# Patient Record
Sex: Male | Born: 1955 | Hispanic: No | Marital: Married | Smoking: Former smoker
Health system: Southern US, Community
[De-identification: ages and names within clinical notes are randomized; demographics above are authoritative.]

## PROBLEM LIST (undated history)

## (undated) DIAGNOSIS — M1990 Unspecified osteoarthritis, unspecified site: Secondary | ICD-10-CM

## (undated) DIAGNOSIS — K219 Gastro-esophageal reflux disease without esophagitis: Secondary | ICD-10-CM

## (undated) DIAGNOSIS — F329 Major depressive disorder, single episode, unspecified: Secondary | ICD-10-CM

## (undated) DIAGNOSIS — C4490 Unspecified malignant neoplasm of skin, unspecified: Secondary | ICD-10-CM

## (undated) DIAGNOSIS — Z8042 Family history of malignant neoplasm of prostate: Secondary | ICD-10-CM

## (undated) DIAGNOSIS — M810 Age-related osteoporosis without current pathological fracture: Secondary | ICD-10-CM

## (undated) DIAGNOSIS — Z803 Family history of malignant neoplasm of breast: Secondary | ICD-10-CM

## (undated) DIAGNOSIS — C61 Malignant neoplasm of prostate: Secondary | ICD-10-CM

## (undated) DIAGNOSIS — F419 Anxiety disorder, unspecified: Secondary | ICD-10-CM

## (undated) DIAGNOSIS — F32A Depression, unspecified: Secondary | ICD-10-CM

## (undated) HISTORY — DX: Family history of malignant neoplasm of breast: Z80.3

## (undated) HISTORY — DX: Age-related osteoporosis without current pathological fracture: M81.0

## (undated) HISTORY — DX: Family history of malignant neoplasm of prostate: Z80.42

---
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
## 2003-03-15 HISTORY — PX: EYE SURGERY: SHX253

## 2004-05-21 DIAGNOSIS — E7800 Pure hypercholesterolemia, unspecified: Secondary | ICD-10-CM | POA: Insufficient documentation

## 2006-03-14 HISTORY — PX: MOLE REMOVAL: SHX2046

## 2006-06-15 DIAGNOSIS — R519 Headache, unspecified: Secondary | ICD-10-CM | POA: Insufficient documentation

## 2008-03-14 HISTORY — PX: NASAL SINUS SURGERY: SHX719

## 2015-01-30 DIAGNOSIS — N529 Male erectile dysfunction, unspecified: Secondary | ICD-10-CM | POA: Insufficient documentation

## 2015-01-30 DIAGNOSIS — F5101 Primary insomnia: Secondary | ICD-10-CM | POA: Insufficient documentation

## 2015-07-17 DIAGNOSIS — D126 Benign neoplasm of colon, unspecified: Secondary | ICD-10-CM | POA: Insufficient documentation

## 2015-09-04 DIAGNOSIS — N1831 Chronic kidney disease, stage 3a: Secondary | ICD-10-CM | POA: Insufficient documentation

## 2016-02-10 DIAGNOSIS — R7303 Prediabetes: Secondary | ICD-10-CM | POA: Insufficient documentation

## 2016-07-12 HISTORY — PX: BICEPS TENDON REPAIR: SHX566

## 2016-11-12 HISTORY — PX: SHOULDER ARTHROSCOPY WITH ROTATOR CUFF REPAIR: SHX5685

## 2018-02-15 DIAGNOSIS — K644 Residual hemorrhoidal skin tags: Secondary | ICD-10-CM | POA: Insufficient documentation

## 2018-08-03 DIAGNOSIS — K219 Gastro-esophageal reflux disease without esophagitis: Secondary | ICD-10-CM | POA: Insufficient documentation

## 2018-08-03 DIAGNOSIS — B351 Tinea unguium: Secondary | ICD-10-CM | POA: Insufficient documentation

## 2018-08-07 DIAGNOSIS — C775 Secondary and unspecified malignant neoplasm of intrapelvic lymph nodes: Secondary | ICD-10-CM | POA: Insufficient documentation

## 2018-08-07 DIAGNOSIS — C61 Malignant neoplasm of prostate: Secondary | ICD-10-CM | POA: Insufficient documentation

## 2018-09-11 DIAGNOSIS — K635 Polyp of colon: Secondary | ICD-10-CM | POA: Insufficient documentation

## 2019-05-22 ENCOUNTER — Other Ambulatory Visit (HOSPITAL_COMMUNITY): Payer: Self-pay | Admitting: Urology

## 2019-05-22 DIAGNOSIS — C61 Malignant neoplasm of prostate: Secondary | ICD-10-CM

## 2019-05-30 ENCOUNTER — Other Ambulatory Visit: Payer: Self-pay | Admitting: Urology

## 2019-06-03 ENCOUNTER — Encounter (HOSPITAL_COMMUNITY)
Admission: RE | Admit: 2019-06-03 | Discharge: 2019-06-03 | Disposition: A | Discharge status: Home / Self Care | Payer: Medicare Other | Source: Ambulatory Visit | Attending: Urology | Admitting: Urology

## 2019-06-03 ENCOUNTER — Other Ambulatory Visit: Payer: Self-pay

## 2019-06-03 DIAGNOSIS — C61 Malignant neoplasm of prostate: Secondary | ICD-10-CM | POA: Diagnosis not present

## 2019-06-03 MED ORDER — TECHNETIUM TC 99M MEDRONATE IV KIT
21.9000 | PACK | Freq: Once | INTRAVENOUS | Status: AC
  Administered 2019-06-03: 21.9 via INTRAVENOUS

## 2019-06-18 NOTE — Patient Instructions (Signed)
DUE TO COVID-19 ONLY ONE VISITOR IS ALLOWED TO COME WITH YOU AND STAY IN THE WAITING ROOM ONLY DURING PRE OP AND PROCEDURE DAY OF SURGERY. THE 1 VISITOR MAY VISIT WITH YOU AFTER SURGERY IN YOUR PRIVATE ROOM DURING VISITING HOURS ONLY!  YOU NEED TO HAVE A COVID 19 TEST ON__4/12_____ @_2 :00PM______, THIS TEST MUST BE DONE BEFORE SURGERY, COME  [name] , [id number].  ([place]) ONCE YOUR COVID TEST IS COMPLETED, PLEASE BEGIN THE QUARANTINE INSTRUCTIONS AS OUTLINED IN YOUR HANDOUT.                Brendan Anderson    Your procedure is scheduled on: 06/27/19   Report to [facility] Main  Entrance   Report to admitting at  9:15 AM     Call this number if you have problems the morning of surgery [phone]    Remember: Do not eat food or drink liquids :After Midnight.    BRUSH YOUR TEETH MORNING OF SURGERY AND RINSE YOUR MOUTH OUT, NO CHEWING GUM CANDY OR MINTS.     Take these medicines the morning of surgery with A SIP OF WATER: Trazodone, Zyrtec, Nexium                                 You may not have any metal on your body including              piercings  Do not wear jewelry,  lotions, powders or  deodorant                       Men may shave face and neck.   Do not bring valuables to the hospital. [place].  Contacts, dentures or bridgework may not be worn into surgery.        Special Instructions: N/A              Please read over the following fact sheets you were given: _____________________________________________________________________             [facility] - Preparing for Surgery Before surgery, you can play an important role.   Because skin is not sterile, your skin needs to be as free of germs as possible.   You can reduce the number of germs on your skin by washing with CHG (chlorahexidine gluconate) soap before surgery .  CHG is an antiseptic cleaner which kills  germs and bonds with the skin to continue killing germs even after washing. Please DO NOT use if you have an allergy to CHG or antibacterial soaps.   If your skin becomes reddened/irritated stop using the CHG and inform your nurse when you arrive at Short Stay. You may shave your face/neck.  Please follow these instructions carefully:  1.  Shower with CHG Soap the night before surgery and the  morning of Surgery.  2.  If you choose to wash your hair, wash your hair first as usual with your  normal  shampoo.  3.  After you shampoo, rinse your hair and body thoroughly to remove the  shampoo.  4.  Use CHG as you would any other liquid soap.  You can apply chg directly  to the skin and wash                       Gently with a scrungie or clean washcloth.  5.  Apply the CHG Soap to your body ONLY FROM THE NECK DOWN.   Do not use on face/ open                           Wound or open sores. Avoid contact with eyes, ears mouth and genitals (private parts).                       Wash face,  Genitals (private parts) with your normal soap.             6.  Wash thoroughly, paying special attention to the area where your surgery  will be performed.  7.  Thoroughly rinse your body with warm water from the neck down.  8.  DO NOT shower/wash with your normal soap after using and rinsing off  the CHG Soap.             9.  Pat yourself dry with a clean towel.            10.  Wear clean pajamas.            11.  Place clean sheets on your bed the night of your first shower and do not  sleep with pets. Day of Surgery : Do not apply any lotions/deodorants the morning of surgery.  Please wear clean clothes to the hospital/surgery center.  FAILURE TO FOLLOW THESE INSTRUCTIONS MAY RESULT IN THE CANCELLATION OF YOUR SURGERY PATIENT SIGNATURE_________________________________  NURSE  SIGNATURE__________________________________  ________________________________________________________________________   Brendan Anderson  An incentive spirometer is a tool that can help keep your lungs clear and active. This tool measures how well you are filling your lungs with each breath. Taking long deep breaths may help reverse or decrease the chance of developing breathing (pulmonary) problems (especially infection) following:  A long period of time when you are unable to move or be active. BEFORE THE PROCEDURE   If the spirometer includes an indicator to show your best effort, your nurse or respiratory therapist will set it to a desired goal.  If possible, sit up straight or lean slightly forward. Try not to slouch.  Hold the incentive spirometer in an upright position. INSTRUCTIONS FOR USE  1. Sit on the edge of your bed if possible, or sit up as far as you can in bed or on a chair. 2. Hold the incentive spirometer in an upright position. 3. Breathe out normally. 4. Place the mouthpiece in your mouth and seal your lips tightly around it. 5. Breathe in slowly and as deeply as possible, raising the piston or the ball toward the top of the column. 6. Hold your breath for 3-5 seconds or for as long as possible. Allow the piston or ball to fall to the bottom of the column. 7. Remove the mouthpiece from your mouth and breathe out normally. 8. Rest for a few seconds and repeat Steps 1 through 7 at least 10 times every 1-2 hours when you are awake. Take your time and take a few normal breaths between deep breaths. 9. The spirometer may include an indicator to show your best effort.  Use the indicator as a goal to work toward during each repetition. 10. After each set of 10 deep breaths, practice coughing to be sure your lungs are clear. If you have an incision (the cut made at the time of surgery), support your incision when coughing by placing a pillow or rolled up towels firmly  against it. Once you are able to get out of bed, walk around indoors and cough well. You may stop using the incentive spirometer when instructed by your caregiver.  RISKS AND COMPLICATIONS  Take your time so you do not get dizzy or light-headed.  If you are in pain, you may need to take or ask for pain medication before doing incentive spirometry. It is harder to take a deep breath if you are having pain. AFTER USE  Rest and breathe slowly and easily.  It can be helpful to keep track of a log of your progress. Your caregiver can provide you with a simple table to help with this. If you are using the spirometer at home, follow these instructions: [place] IF:   You are having difficultly using the spirometer.  You have trouble using the spirometer as often as instructed.  Your pain medication is not giving enough relief while using the spirometer.  You develop fever of 100.5 F (38.1 C) or higher. SEEK IMMEDIATE MEDICAL CARE IF:   You cough up bloody sputum that had not been present before.  You develop fever of 102 F (38.9 C) or greater.  You develop worsening pain at or near the incision site. MAKE SURE YOU:   Understand these instructions.  Will watch your condition.  Will get help right away if you are not doing well or get worse. Document Released: 07/11/2006 Document Revised: 05/23/2011 Document Reviewed: 09/11/2006 [facility] Patient Information 2014 [place], [place].   ________________________________________________________________________

## 2019-06-19 ENCOUNTER — Other Ambulatory Visit: Payer: Self-pay

## 2019-06-19 ENCOUNTER — Encounter (HOSPITAL_COMMUNITY)
Admission: RE | Admit: 2019-06-19 | Discharge: 2019-06-19 | Disposition: A | Discharge status: Home / Self Care | Payer: Medicare Other | Source: Ambulatory Visit | Attending: Urology | Admitting: Urology

## 2019-06-19 ENCOUNTER — Encounter (HOSPITAL_COMMUNITY): Payer: Self-pay

## 2019-06-19 DIAGNOSIS — Z01812 Encounter for preprocedural laboratory examination: Secondary | ICD-10-CM | POA: Insufficient documentation

## 2019-06-19 HISTORY — DX: Depression, unspecified: F32.A

## 2019-06-19 HISTORY — DX: Unspecified osteoarthritis, unspecified site: M19.90

## 2019-06-19 HISTORY — DX: Gastro-esophageal reflux disease without esophagitis: K21.9

## 2019-06-19 HISTORY — DX: Anxiety disorder, unspecified: F41.9

## 2019-06-19 LAB — BASIC METABOLIC PANEL
Anion gap: 9 (ref 5–15)
BUN: 20 mg/dL (ref 8–23)
CO2: 26 mmol/L (ref 22–32)
Calcium: 9.1 mg/dL (ref 8.9–10.3)
Chloride: 106 mmol/L (ref 98–111)
Creatinine, Ser: 1.21 mg/dL (ref 0.61–1.24)
GFR calc Af Amer: >60 mL/min (ref >60)
GFR calc non Af Amer: >60 mL/min (ref >60)
Glucose, Bld: 116 mg/dL — ABNORMAL HIGH (ref 70–99)
Potassium: 4.1 mmol/L (ref 3.5–5.1)
Sodium: 141 mmol/L (ref 135–145)

## 2019-06-19 LAB — CBC
HCT: 45 % (ref 39.0–52.0)
Hemoglobin: 14.8 g/dL (ref 13.0–17.0)
MCH: 29.8 pg (ref 26.0–34.0)
MCHC: 32.9 g/dL (ref 30.0–36.0)
MCV: 90.5 fL (ref 80.0–100.0)
Platelets: 185 10*3/uL (ref 150–400)
RBC: 4.97 MIL/uL (ref 4.22–5.81)
RDW: 12.2 % (ref 11.5–15.5)
WBC: 5.3 10*3/uL (ref 4.0–10.5)
nRBC: 0 % (ref 0.0–0.2)

## 2019-06-19 LAB — ABO/RH: ABO/RH(D): A POS

## 2019-06-19 NOTE — Progress Notes (Signed)
PCP - Dr. Eber Hong Cardiologist - no  Chest x-ray - no EKG - no Stress Test - no ECHO - no Cardiac Cath - no  Sleep Study - yes. Negative findings CPAP - no  Fasting Blood Sugar - NA Checks Blood Sugar _____ times a day  Blood Thinner Instructions:NAAspirin Instructions: Last Dose:  Anesthesia review:   Patient denies shortness of breath, fever, cough and chest pain at PAT appointment yes  Patient verbalized understanding of instructions that were given to them at the PAT appointment. Patient was also instructed that they will need to review over the PAT instructions again at home before surgery. yes

## 2019-06-24 ENCOUNTER — Other Ambulatory Visit (HOSPITAL_COMMUNITY)
Admission: RE | Admit: 2019-06-24 | Discharge: 2019-06-24 | Disposition: A | Discharge status: Home / Self Care | Payer: Medicare Other | Source: Ambulatory Visit | Attending: Urology | Admitting: Urology

## 2019-06-24 DIAGNOSIS — Z20822 Contact with and (suspected) exposure to covid-19: Secondary | ICD-10-CM | POA: Diagnosis not present

## 2019-06-24 DIAGNOSIS — Z01812 Encounter for preprocedural laboratory examination: Secondary | ICD-10-CM | POA: Insufficient documentation

## 2019-06-24 LAB — SARS CORONAVIRUS 2 (TAT 6-24 HRS): SARS Coronavirus 2: NEGATIVE

## 2019-06-26 NOTE — H&P (Signed)
CC/HPI: CC: Prostate Cancer   Physician requesting consult: Dr. Roxanne Gates  PCP: Dr. Eber Hong   Brendan Anderson is a 64 year old gentleman who was found to have an elevated PSA of 6.3 prompting a TRUS biopsy of the prostate on 04/23/19. 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   Family history: None.   Imaging studies: PENDING   PMH: He has a history of laryngopharyngeal reflux, depression, and arthritis.  PSH: No abdominal surgery.   TNM stage: cT1c N? M?  PSA: 6.3  Gleason score: 4+5=9  Biopsy (04/23/19): 6/12 cores positive  Left: L lateral apex (8%, 3+3=6)  Right: R lateral apex (60%, 5%, 40%, 4+5=9), R lateral mid (30%, 75%, 4+5=9)  Prostate volume: 41 cc   Nomogram  OC disease: 17%  EPE: 79%  SVI: 31%  LNI: 25%  PFS (5 year, 10 year): 47%, 32%   Urinary function: IPSS is 3.  Erectile function: SHIM score is 13. He is able to have fairly reliable erections with sildenafil, however.     ALLERGIES: Codeine    MEDICATIONS: Allergy  Esomeprazole Magnesium  Gaviscon  Multiple Vitamin  Sildenafil Citrate 100 mg tablet  Trazodone Hcl     GU PSH: None   NON-GU PSH: Cataract surgery Elbow Surgery (Unspecified) Rotator cuff surgery Sinus Surgery Procedure     GU PMH: None   NON-GU PMH: Arthritis GERD    FAMILY HISTORY: Breast Cancer - Mother    Notes: 1 son, 1 daughter   SOCIAL HISTORY: Marital Status: Married Preferred Language: English; Ethnicity: Not Hispanic Or Latino; Race: White Current Smoking Status: Patient does not smoke anymore. Has not smoked since 05/12/2009. Smoked for 40 years. Smoked 1 pack per day.   Tobacco Use Assessment Completed: Used Tobacco in last 30 days? Does not drink anymore.  Does not drink caffeine.    REVIEW OF SYSTEMS:    GU Review Male:   Patient reports get up at night to urinate. Patient denies frequent urination, hard to postpone urination, burning/ pain  with urination, leakage of urine, stream starts and stops, trouble starting your streams, and have to strain to urinate .  Gastrointestinal (Lower):   Patient denies diarrhea and constipation.  Gastrointestinal (Upper):   Patient denies nausea and vomiting.  Constitutional:   Patient reports fatigue. Patient denies fever, night sweats, and weight loss.  Skin:   Patient denies skin rash/ lesion and itching.  Eyes:   Patient denies blurred vision and double vision.  Ears/ Nose/ Throat:   Patient reports sinus problems. Patient denies sore throat.  Hematologic/Lymphatic:   Patient denies swollen glands and easy bruising.  Cardiovascular:   Patient denies leg swelling and chest pains.  Respiratory:   Patient denies cough and shortness of breath.  Endocrine:   Patient denies excessive thirst.  Musculoskeletal:   Patient reports back pain and joint pain.   Neurological:   Patient denies headaches and dizziness.  Psychologic:   Patient denies depression and anxiety.   VITAL SIGNS:      05/21/2019 11:04 AM  Weight 170 lb / 77.11 kg  Height 69 in / 175.26 cm BMI 25.1 kg/m     MULTI-SYSTEM PHYSICAL EXAMINATION:    Constitutional: Well-nourished. No physical deformities. Normally developed. Good grooming.  Neck: Neck symmetrical, not swollen. Normal tracheal position.  Respiratory: No labored breathing, no use of accessory muscles. Clear bilaterally  Cardiovascular: Normal temperature, normal extremity pulses, no  swelling, no varicosities. Regular rate and rhythm.  Lymphatic: No enlargement of neck, axillae, groin.  Skin: No paleness, no jaundice, no cyanosis. No lesion, no ulcer, no rash.  Neurologic / Psychiatric: Oriented to time, oriented to place, oriented to person. No depression, no anxiety, no agitation.  Gastrointestinal: No mass, no tenderness, no rigidity, non obese abdomen.  Eyes: Normal conjunctivae. Normal eyelids.  Ears, Nose, Mouth, and Throat: Left ear no scars, no lesions, no  masses. Right ear no scars, no lesions, no masses. Nose no scars, no lesions, no masses. Normal hearing. Normal lips.  Musculoskeletal: Normal gait and station of head and neck.      ASSESSMENT:      ICD-10 Details  1 GU:   Prostate Cancer - C61    PLAN:          1. High-risk prostate cancer: I had a detailed discussion with Brendan Anderson today regarding his prostate cancer diagnosis. We discussed the high grade nature his prostate cancer. I did recommend that he proceed with additional staging studies. He prefers to have these done in [place] and will be scheduled for a CT scan of the abdomen and pelvis as well as a bone scan. Assuming that he does not have evidence of obvious metastatic disease, we still discuss the potential risk of micro metastatic disease and the possible need for multi modality therapy to offer him the best chance for long-term disease-free survival.   The patient was counseled about the natural history of prostate cancer and the standard treatment options that are available for prostate cancer. It was explained to him how his age and life expectancy, clinical stage, Gleason score, and PSA affect his prognosis, the decision to proceed with additional staging studies, as well as how that information influences recommended treatment strategies. We discussed the roles for active surveillance, radiation therapy, surgical therapy, androgen deprivation, as well as ablative therapy options for the treatment of prostate cancer as appropriate to his individual cancer situation. We discussed the risks and benefits of these options with regard to their impact on cancer control and also in terms of potential adverse events, complications, and impact on quality of life particularly related to urinary and sexual function. The patient was encouraged to ask questions throughout the discussion today and all questions were answered to his stated satisfaction. In addition, the patient was  provided with and/or directed to appropriate resources and literature for further education about prostate cancer and treatment options. We discussed surgical therapy for prostate cancer including the different available surgical approaches. We discussed, in detail, the risks and expectations of surgery with regard to cancer control, urinary control, and erectile function as well as the expected postoperative recovery process. Additional risks of surgery including but not limited to bleeding, infection, hernia formation, nerve damage, lymphocele formation, bowel/rectal injury potentially necessitating colostomy, damage to the urinary tract resulting in urine leakage, urethral stricture, and the cardiopulmonary risks such as myocardial infarction, stroke, death, venothromboembolism, etc. were explained. The risk of open surgical conversion for robotic/laparoscopic prostatectomy was also discussed.   After our discussion, he does adamantly wished to proceed with primary surgical therapy if his staging studies do not indicate metastatic disease. We have agreed to proceed with scheduling pending these results. He will be scheduled for a unilateral left nerve-sparing robot assisted laparoscopic radical prostatectomy and bilateral pelvic lymphadenectomy. He has appropriate expectations with regard to erectile function considering his preoperative dysfunction and need to undergo unilateral nerve-sparing considering his higher volume and  high-grade disease on the right side of the prostate.

## 2019-06-27 ENCOUNTER — Encounter (HOSPITAL_COMMUNITY): Payer: Self-pay | Admitting: Urology

## 2019-06-27 ENCOUNTER — Other Ambulatory Visit: Payer: Self-pay

## 2019-06-27 ENCOUNTER — Ambulatory Visit (HOSPITAL_COMMUNITY): Payer: Medicare Other | Admitting: Anesthesiology

## 2019-06-27 ENCOUNTER — Encounter (HOSPITAL_COMMUNITY)
Admission: RE | Disposition: A | Discharge status: Home / Self Care | Payer: Self-pay | Source: Ambulatory Visit | Attending: Urology

## 2019-06-27 ENCOUNTER — Observation Stay (HOSPITAL_COMMUNITY)
Admission: RE | Admit: 2019-06-27 | Discharge: 2019-06-28 | Disposition: A | Discharge status: Home / Self Care | Payer: Medicare Other | Source: Ambulatory Visit | Attending: Urology | Admitting: Urology

## 2019-06-27 DIAGNOSIS — C61 Malignant neoplasm of prostate: Principal | ICD-10-CM | POA: Insufficient documentation

## 2019-06-27 DIAGNOSIS — Z885 Allergy status to narcotic agent status: Secondary | ICD-10-CM | POA: Diagnosis not present

## 2019-06-27 DIAGNOSIS — M1990 Unspecified osteoarthritis, unspecified site: Secondary | ICD-10-CM | POA: Insufficient documentation

## 2019-06-27 DIAGNOSIS — K219 Gastro-esophageal reflux disease without esophagitis: Secondary | ICD-10-CM | POA: Insufficient documentation

## 2019-06-27 DIAGNOSIS — Z87891 Personal history of nicotine dependence: Secondary | ICD-10-CM | POA: Insufficient documentation

## 2019-06-27 HISTORY — PX: LYMPHADENECTOMY: SHX5960

## 2019-06-27 HISTORY — PX: ROBOT ASSISTED LAPAROSCOPIC RADICAL PROSTATECTOMY: SHX5141

## 2019-06-27 LAB — TYPE AND SCREEN
ABO/RH(D): A POS
Antibody Screen: NEGATIVE

## 2019-06-27 LAB — HEMOGLOBIN AND HEMATOCRIT, BLOOD
HCT: 42.6 % (ref 39.0–52.0)
Hemoglobin: 13.9 g/dL (ref 13.0–17.0)

## 2019-06-27 SURGERY — XI ROBOTIC ASSISTED LAPAROSCOPIC RADICAL PROSTATECTOMY LEVEL 2
Anesthesia: General

## 2019-06-27 MED ORDER — BUPIVACAINE-EPINEPHRINE 0.25% -1:200000 IJ SOLN
INTRAMUSCULAR | Status: DC | PRN
  Administered 2019-06-27: 26 mL

## 2019-06-27 MED ORDER — STERILE WATER FOR IRRIGATION IR SOLN
Status: DC | PRN
  Administered 2019-06-27: 1000 mL

## 2019-06-27 MED ORDER — PROPOFOL 10 MG/ML IV BOLUS
INTRAVENOUS | Status: AC
  Filled 2019-06-27: qty 20

## 2019-06-27 MED ORDER — ONDANSETRON HCL 4 MG/2ML IJ SOLN
INTRAMUSCULAR | Status: AC
  Filled 2019-06-27: qty 2

## 2019-06-27 MED ORDER — DEXAMETHASONE SODIUM PHOSPHATE 10 MG/ML IJ SOLN
INTRAMUSCULAR | Status: AC
  Filled 2019-06-27: qty 1

## 2019-06-27 MED ORDER — CEFAZOLIN SODIUM-DEXTROSE 1-4 GM/50ML-% IV SOLN
1.0000 g | Freq: Three times a day (TID) | INTRAVENOUS | Status: AC
  Administered 2019-06-27 – 2019-06-28 (×2): 1 g via INTRAVENOUS
  Filled 2019-06-27 (×2): qty 50

## 2019-06-27 MED ORDER — LACTATED RINGERS IV SOLN: INTRAVENOUS | Status: DC

## 2019-06-27 MED ORDER — DOCUSATE SODIUM 100 MG PO CAPS
100.0000 mg | ORAL_CAPSULE | Freq: Two times a day (BID) | ORAL | Status: DC
  Administered 2019-06-27 – 2019-06-28 (×2): 100 mg via ORAL
  Filled 2019-06-27 (×2): qty 1

## 2019-06-27 MED ORDER — LIDOCAINE 2% (20 MG/ML) 5 ML SYRINGE
INTRAMUSCULAR | Status: AC
  Filled 2019-06-27: qty 5

## 2019-06-27 MED ORDER — SULFAMETHOXAZOLE-TRIMETHOPRIM 800-160 MG PO TABS: 1.0000 | ORAL_TABLET | Freq: Two times a day (BID) | ORAL | 0 refills | Status: DC

## 2019-06-27 MED ORDER — ACETAMINOPHEN 325 MG PO TABS
650.0000 mg | ORAL_TABLET | ORAL | Status: DC | PRN
  Administered 2019-06-28: 650 mg via ORAL
  Filled 2019-06-27: qty 2

## 2019-06-27 MED ORDER — BELLADONNA ALKALOIDS-OPIUM 16.2-60 MG RE SUPP
RECTAL | Status: AC
  Filled 2019-06-27: qty 1

## 2019-06-27 MED ORDER — ALBUMIN HUMAN 5 % IV SOLN: INTRAVENOUS | Status: DC | PRN

## 2019-06-27 MED ORDER — DIPHENHYDRAMINE HCL 12.5 MG/5ML PO ELIX: 12.5000 mg | ORAL_SOLUTION | Freq: Four times a day (QID) | ORAL | Status: DC | PRN

## 2019-06-27 MED ORDER — MIDAZOLAM HCL 2 MG/2ML IJ SOLN
INTRAMUSCULAR | Status: DC | PRN
  Administered 2019-06-27: 2 mg via INTRAVENOUS

## 2019-06-27 MED ORDER — SUFENTANIL CITRATE 50 MCG/ML IV SOLN
INTRAVENOUS | Status: AC
  Filled 2019-06-27: qty 1

## 2019-06-27 MED ORDER — HEPARIN SODIUM (PORCINE) 1000 UNIT/ML IJ SOLN
INTRAMUSCULAR | Status: AC
  Filled 2019-06-27: qty 1

## 2019-06-27 MED ORDER — MORPHINE SULFATE (PF) 2 MG/ML IV SOLN: 2.0000 mg | INTRAVENOUS | Status: DC | PRN

## 2019-06-27 MED ORDER — PANTOPRAZOLE SODIUM 40 MG PO TBEC
40.0000 mg | DELAYED_RELEASE_TABLET | Freq: Every day | ORAL | Status: DC
  Administered 2019-06-28: 40 mg via ORAL
  Filled 2019-06-27: qty 1

## 2019-06-27 MED ORDER — ONDANSETRON HCL 4 MG/2ML IJ SOLN: 4.0000 mg | INTRAMUSCULAR | Status: DC | PRN

## 2019-06-27 MED ORDER — KCL IN DEXTROSE-NACL 20-5-0.45 MEQ/L-%-% IV SOLN
INTRAVENOUS | Status: DC
  Filled 2019-06-27 (×2): qty 1000

## 2019-06-27 MED ORDER — HYDROMORPHONE HCL 1 MG/ML IJ SOLN
INTRAMUSCULAR | Status: DC | PRN
  Administered 2019-06-27 (×4): .5 mg via INTRAVENOUS

## 2019-06-27 MED ORDER — SCOPOLAMINE 1 MG/3DAYS TD PT72
1.0000 | MEDICATED_PATCH | TRANSDERMAL | Status: DC
  Administered 2019-06-27: 1.5 mg via TRANSDERMAL
  Filled 2019-06-27: qty 1

## 2019-06-27 MED ORDER — LIDOCAINE 2% (20 MG/ML) 5 ML SYRINGE
INTRAMUSCULAR | Status: DC | PRN
  Administered 2019-06-27: 60 mg via INTRAVENOUS

## 2019-06-27 MED ORDER — SUFENTANIL CITRATE 50 MCG/ML IV SOLN
INTRAVENOUS | Status: DC | PRN
  Administered 2019-06-27 (×2): 10 ug via INTRAVENOUS
  Administered 2019-06-27 (×2): 5 ug via INTRAVENOUS
  Administered 2019-06-27: 20 ug via INTRAVENOUS

## 2019-06-27 MED ORDER — PROPOFOL 10 MG/ML IV BOLUS
INTRAVENOUS | Status: DC | PRN
  Administered 2019-06-27: 120 mg via INTRAVENOUS
  Administered 2019-06-27: 30 mg via INTRAVENOUS

## 2019-06-27 MED ORDER — ONDANSETRON HCL 4 MG/2ML IJ SOLN
INTRAMUSCULAR | Status: DC | PRN
  Administered 2019-06-27: 4 mg via INTRAVENOUS

## 2019-06-27 MED ORDER — MIDAZOLAM HCL 2 MG/2ML IJ SOLN
INTRAMUSCULAR | Status: AC
  Filled 2019-06-27: qty 2

## 2019-06-27 MED ORDER — ZOLPIDEM TARTRATE 5 MG PO TABS: 5.0000 mg | ORAL_TABLET | Freq: Every evening | ORAL | Status: DC | PRN

## 2019-06-27 MED ORDER — PROMETHAZINE HCL 25 MG/ML IJ SOLN: 6.2500 mg | INTRAMUSCULAR | Status: DC | PRN

## 2019-06-27 MED ORDER — CEFAZOLIN SODIUM-DEXTROSE 2-4 GM/100ML-% IV SOLN
2.0000 g | Freq: Once | INTRAVENOUS | Status: AC
  Administered 2019-06-27: 2 g via INTRAVENOUS
  Filled 2019-06-27: qty 100

## 2019-06-27 MED ORDER — ROCURONIUM BROMIDE 10 MG/ML (PF) SYRINGE
PREFILLED_SYRINGE | INTRAVENOUS | Status: AC
  Filled 2019-06-27: qty 10

## 2019-06-27 MED ORDER — DIPHENHYDRAMINE HCL 50 MG/ML IJ SOLN: 12.5000 mg | Freq: Four times a day (QID) | INTRAMUSCULAR | Status: DC | PRN

## 2019-06-27 MED ORDER — FENTANYL CITRATE (PF) 100 MCG/2ML IJ SOLN: 25.0000 ug | INTRAMUSCULAR | Status: DC | PRN

## 2019-06-27 MED ORDER — BELLADONNA ALKALOIDS-OPIUM 16.2-60 MG RE SUPP
1.0000 | Freq: Four times a day (QID) | RECTAL | Status: DC | PRN
  Administered 2019-06-27: 1 via RECTAL

## 2019-06-27 MED ORDER — FLEET ENEMA 7-19 GM/118ML RE ENEM: 1.0000 | ENEMA | Freq: Once | RECTAL | Status: DC

## 2019-06-27 MED ORDER — KETOROLAC TROMETHAMINE 15 MG/ML IJ SOLN
15.0000 mg | Freq: Four times a day (QID) | INTRAMUSCULAR | Status: DC
  Administered 2019-06-27 – 2019-06-28 (×3): 15 mg via INTRAVENOUS
  Filled 2019-06-27 (×2): qty 1

## 2019-06-27 MED ORDER — SODIUM CHLORIDE (PF) 0.9 % IJ SOLN
INTRAMUSCULAR | Status: AC
  Filled 2019-06-27: qty 10

## 2019-06-27 MED ORDER — LORATADINE 10 MG PO TABS
10.0000 mg | ORAL_TABLET | Freq: Every day | ORAL | Status: DC
  Administered 2019-06-27 – 2019-06-28 (×2): 10 mg via ORAL
  Filled 2019-06-27 (×2): qty 1

## 2019-06-27 MED ORDER — SODIUM CHLORIDE 0.9 % IR SOLN
Status: DC | PRN
  Administered 2019-06-27: 1000 mL

## 2019-06-27 MED ORDER — ESMOLOL HCL 100 MG/10ML IV SOLN
INTRAVENOUS | Status: AC
  Filled 2019-06-27: qty 10

## 2019-06-27 MED ORDER — ROCURONIUM BROMIDE 10 MG/ML (PF) SYRINGE
PREFILLED_SYRINGE | INTRAVENOUS | Status: DC | PRN
  Administered 2019-06-27: 20 mg via INTRAVENOUS
  Administered 2019-06-27: 70 mg via INTRAVENOUS

## 2019-06-27 MED ORDER — DEXAMETHASONE SODIUM PHOSPHATE 10 MG/ML IJ SOLN
INTRAMUSCULAR | Status: DC | PRN
  Administered 2019-06-27: 10 mg via INTRAVENOUS

## 2019-06-27 MED ORDER — SUGAMMADEX SODIUM 200 MG/2ML IV SOLN
INTRAVENOUS | Status: DC | PRN
  Administered 2019-06-27: 200 mg via INTRAVENOUS

## 2019-06-27 MED ORDER — LACTATED RINGERS IV SOLN: INTRAVENOUS | Status: DC | PRN

## 2019-06-27 MED ORDER — ACETAMINOPHEN 500 MG PO TABS
1000.0000 mg | ORAL_TABLET | Freq: Once | ORAL | Status: AC
  Administered 2019-06-27: 1000 mg via ORAL
  Filled 2019-06-27: qty 2

## 2019-06-27 MED ORDER — MAGNESIUM CITRATE PO SOLN: 1.0000 | Freq: Once | ORAL | Status: DC

## 2019-06-27 MED ORDER — TRAMADOL HCL 50 MG PO TABS: 50.0000 mg | ORAL_TABLET | Freq: Four times a day (QID) | ORAL | 0 refills | Status: DC | PRN

## 2019-06-27 MED ORDER — ESMOLOL HCL 100 MG/10ML IV SOLN
INTRAVENOUS | Status: DC | PRN
  Administered 2019-06-27: 20 mg via INTRAVENOUS
  Administered 2019-06-27: 30 mg via INTRAVENOUS
  Administered 2019-06-27 (×2): 20 mg via INTRAVENOUS

## 2019-06-27 MED ORDER — BACITRACIN-NEOMYCIN-POLYMYXIN 400-5-5000 EX OINT: 1.0000 "application " | TOPICAL_OINTMENT | Freq: Three times a day (TID) | CUTANEOUS | Status: DC | PRN

## 2019-06-27 MED ORDER — BUPIVACAINE HCL 0.25 % IJ SOLN
INTRAMUSCULAR | Status: AC
  Filled 2019-06-27: qty 1

## 2019-06-27 MED ORDER — HYDROMORPHONE HCL 2 MG/ML IJ SOLN
INTRAMUSCULAR | Status: AC
  Filled 2019-06-27: qty 1

## 2019-06-27 MED ORDER — CELECOXIB 200 MG PO CAPS
200.0000 mg | ORAL_CAPSULE | Freq: Once | ORAL | Status: AC
  Administered 2019-06-27: 200 mg via ORAL
  Filled 2019-06-27: qty 1

## 2019-06-27 MED ORDER — SODIUM CHLORIDE 0.9 % IV BOLUS
1000.0000 mL | Freq: Once | INTRAVENOUS | Status: AC
  Administered 2019-06-27: 1000 mL via INTRAVENOUS

## 2019-06-27 MED ORDER — TRAZODONE HCL 100 MG PO TABS
100.0000 mg | ORAL_TABLET | Freq: Every day | ORAL | Status: DC
  Administered 2019-06-27: 100 mg via ORAL
  Filled 2019-06-27: qty 1

## 2019-06-27 SURGICAL SUPPLY — 62 items
APPLICATOR COTTON TIP 6 STRL (MISCELLANEOUS) ×2 IMPLANT
APPLICATOR COTTON TIP 6IN STRL (MISCELLANEOUS) ×4
CATH FOLEY 2WAY SLVR 18FR 30CC (CATHETERS) ×4 IMPLANT
CATH ROBINSON RED A/P 16FR (CATHETERS) ×4 IMPLANT
CATH ROBINSON RED A/P 8FR (CATHETERS) ×4 IMPLANT
CATH TIEMANN FOLEY 18FR 5CC (CATHETERS) ×4 IMPLANT
CHLORAPREP W/TINT 26 (MISCELLANEOUS) ×4 IMPLANT
CLIP VESOLOCK LG 6/CT PURPLE (CLIP) ×8 IMPLANT
COVER SURGICAL LIGHT HANDLE (MISCELLANEOUS) ×4 IMPLANT
COVER TIP SHEARS 8 DVNC (MISCELLANEOUS) ×2 IMPLANT
COVER TIP SHEARS 8MM DA VINCI (MISCELLANEOUS) ×2
COVER WAND RF STERILE (DRAPES) IMPLANT
CUTTER ECHEON FLEX ENDO 45 340 (ENDOMECHANICALS) ×4 IMPLANT
DECANTER SPIKE VIAL GLASS SM (MISCELLANEOUS) ×4 IMPLANT
DERMABOND ADVANCED (GAUZE/BANDAGES/DRESSINGS) ×2
DERMABOND ADVANCED .7 DNX12 (GAUZE/BANDAGES/DRESSINGS) ×2 IMPLANT
DRAIN CHANNEL RND F F (WOUND CARE) IMPLANT
DRAPE ARM DVNC X/XI (DISPOSABLE) ×8 IMPLANT
DRAPE COLUMN DVNC XI (DISPOSABLE) ×2 IMPLANT
DRAPE DA VINCI XI ARM (DISPOSABLE) ×8
DRAPE DA VINCI XI COLUMN (DISPOSABLE) ×2
DRAPE SURG IRRIG POUCH 19X23 (DRAPES) ×4 IMPLANT
DRSG TEGADERM 4X4.75 (GAUZE/BANDAGES/DRESSINGS) ×4 IMPLANT
ELECT PENCIL ROCKER SW 15FT (MISCELLANEOUS) ×2 IMPLANT
ELECT REM PT RETURN 15FT ADLT (MISCELLANEOUS) ×4 IMPLANT
GLOVE BIO SURGEON STRL SZ 6.5 (GLOVE) ×3 IMPLANT
GLOVE BIO SURGEONS STRL SZ 6.5 (GLOVE) ×1
GLOVE BIOGEL M STRL SZ7.5 (GLOVE) ×8 IMPLANT
GOWN STRL REUS W/TWL LRG LVL3 (GOWN DISPOSABLE) ×14 IMPLANT
HOLDER FOLEY CATH W/STRAP (MISCELLANEOUS) ×4 IMPLANT
IRRIG SUCT STRYKERFLOW 2 WTIP (MISCELLANEOUS) ×4
IRRIGATION SUCT STRKRFLW 2 WTP (MISCELLANEOUS) ×2 IMPLANT
IV LACTATED RINGERS 1000ML (IV SOLUTION) ×4 IMPLANT
KIT TURNOVER KIT A (KITS) IMPLANT
NDL SAFETY ECLIPSE 18X1.5 (NEEDLE) ×2 IMPLANT
NEEDLE HYPO 18GX1.5 SHARP (NEEDLE) ×2
PACK ROBOT UROLOGY CUSTOM (CUSTOM PROCEDURE TRAY) ×4 IMPLANT
PENCIL SMOKE EVACUATOR (MISCELLANEOUS) IMPLANT
RELOAD STAPLE 45 4.1 GRN THCK (STAPLE) ×2 IMPLANT
SEAL CANN UNIV 5-8 DVNC XI (MISCELLANEOUS) ×8 IMPLANT
SEAL XI 5MM-8MM UNIVERSAL (MISCELLANEOUS) ×8
SET TUBE SMOKE EVAC HIGH FLOW (TUBING) ×4 IMPLANT
SOLUTION ELECTROLUBE (MISCELLANEOUS) ×4 IMPLANT
STAPLE RELOAD 45 GRN (STAPLE) ×2 IMPLANT
STAPLE RELOAD 45MM GREEN (STAPLE) ×2
SUT ETHILON 3 0 PS 1 (SUTURE) ×4 IMPLANT
SUT MNCRL 3 0 RB1 (SUTURE) ×2 IMPLANT
SUT MNCRL 3 0 VIOLET RB1 (SUTURE) ×2 IMPLANT
SUT MNCRL AB 4-0 PS2 18 (SUTURE) ×8 IMPLANT
SUT MONOCRYL 3 0 RB1 (SUTURE) ×4
SUT VIC AB 0 CT1 27 (SUTURE) ×2
SUT VIC AB 0 CT1 27XBRD ANTBC (SUTURE) ×2 IMPLANT
SUT VIC AB 0 UR5 27 (SUTURE) ×4 IMPLANT
SUT VIC AB 2-0 SH 27 (SUTURE) ×2
SUT VIC AB 2-0 SH 27X BRD (SUTURE) ×2 IMPLANT
SUT VIC AB 3-0 SH 27 (SUTURE) ×2
SUT VIC AB 3-0 SH 27XBRD (SUTURE) IMPLANT
SUT VICRYL 0 UR6 27IN ABS (SUTURE) ×8 IMPLANT
SYR 27GX1/2 1ML LL SAFETY (SYRINGE) ×4 IMPLANT
TOWEL OR NON WOVEN STRL DISP B (DISPOSABLE) ×4 IMPLANT
TROCAR XCEL NON-BLD 5MMX100MML (ENDOMECHANICALS) IMPLANT
WATER STERILE IRR 1000ML POUR (IV SOLUTION) ×4 IMPLANT

## 2019-06-27 NOTE — Progress Notes (Signed)
Patient ID: Kate Stoneburner, male   DOB: 05-Feb-1956, 64 y.o.   MRN: [id number]  Post-op note  Subjective: The patient is doing well.  No complaints.  Objective: Vital signs in last 24 hours: Temp:  [97.8 F (36.6 C)-98.7 F (37.1 C)] 97.8 F (36.6 C) (04/15 1342) Pulse Rate:  [74-84] 84 (04/15 1345) Resp:  [15-18] 15 (04/15 1345) BP: (144-160)/(75-89) 160/88 (04/15 1345) SpO2:  [100 %] 100 % (04/15 1345) Weight:  [78.6 kg] 78.6 kg (04/15 0948)  Intake/Output from previous day: No intake/output data recorded. Intake/Output this shift: Total I/O In: 2750 [I.V.:2400; IV Piggyback:350] Out: 250 [Blood:250]  Physical Exam:  General: Alert and oriented. Abdomen: Soft, Nondistended. Incisions: Clean and dry. Urine: Light pink.  Lab Results: Recent Labs    06/27/19 1400  HGB 13.9  HCT 42.6    Assessment/Plan: POD#0   1) Continue to monitor, ambulate, IS   Pryor Curia. MD   LOS: 0 days   Dutch Gray 06/27/2019, 3:29 PM

## 2019-06-27 NOTE — Anesthesia Procedure Notes (Signed)
Procedure Name: Intubation Date/Time: 06/27/2019 10:57 AM Performed by: Sharlette Dense, CRNA Patient Re-evaluated:Patient Re-evaluated prior to induction Oxygen Delivery Method: Circle system utilized Preoxygenation: Pre-oxygenation with 100% oxygen Induction Type: IV induction Ventilation: Mask ventilation without difficulty and Oral airway inserted - appropriate to patient size Laryngoscope Size: Miller and 3 Grade View: Grade I Tube size: 8.0 mm Number of attempts: 1 Airway Equipment and Method: Stylet Placement Confirmation: ETT inserted through vocal cords under direct vision,  positive ETCO2 and breath sounds checked- equal and bilateral Secured at: 22 cm Tube secured with: Tape Dental Injury: Teeth and Oropharynx as per pre-operative assessment

## 2019-06-27 NOTE — Anesthesia Preprocedure Evaluation (Addendum)
Anesthesia Evaluation  Patient identified by MRN, date of birth, ID band Patient awake    Reviewed: Allergy & Precautions, NPO status , Patient's Chart, lab work & pertinent test results  History of Anesthesia Complications Negative for: history of anesthetic complications  Airway Mallampati: II  TM Distance: >3 FB Neck ROM: Full    Dental no notable dental hx. (+) Dental Advisory Given   Pulmonary neg pulmonary ROS, former smoker,    Pulmonary exam normal        Cardiovascular negative cardio ROS Normal cardiovascular exam     Neuro/Psych PSYCHIATRIC DISORDERS Anxiety Depression negative neurological ROS     GI/Hepatic Neg liver ROS, GERD  Medicated,  Endo/Other  negative endocrine ROS  Renal/GU negative Renal ROS     Musculoskeletal negative musculoskeletal ROS (+)   Abdominal   Peds  Hematology negative hematology ROS (+)   Anesthesia Other Findings Day of surgery medications reviewed with the patient.  Reproductive/Obstetrics                            Anesthesia Physical Anesthesia Plan  ASA: II  Anesthesia Plan: General   Post-op Pain Management:    Induction: Intravenous  PONV Risk Score and Plan: Ondansetron, Dexamethasone, Midazolam and Scopolamine patch - Pre-op  Airway Management Planned: Oral ETT  Additional Equipment:   Intra-op Plan:   Post-operative Plan: Extubation in OR  Informed Consent: I have reviewed the patients History and Physical, chart, labs and discussed the procedure including the risks, benefits and alternatives for the proposed anesthesia with the patient or authorized representative who has indicated his/her understanding and acceptance.     Dental advisory given  Plan Discussed with: Anesthesiologist, CRNA and Surgeon  Anesthesia Plan Comments:        Anesthesia Quick Evaluation

## 2019-06-27 NOTE — Discharge Instructions (Signed)

## 2019-06-27 NOTE — Op Note (Signed)
Preoperative diagnosis: Clinically localized adenocarcinoma of the prostate (clinical stage T1c N0 M0)  Postoperative diagnosis: Clinically localized adenocarcinoma of the prostate (clinical stage T1c N0 M0)  Procedure:  1. Robotic assisted laparoscopic radical prostatectomy (left nerve sparing) 2. Bilateral robotic assisted laparoscopic pelvic lymphadenectomy  Surgeon: Pryor Curia. M.D.  Assistant(s): Debbrah Alar, PA-C  An assistant was required for this surgical procedure.  The duties of the assistant included but were not limited to suctioning, passing suture, camera manipulation, retraction. This procedure would not be able to be performed without an Environmental consultant.   Anesthesia: General  Complications: None  EBL: 150 mL  IVF:  2000 mL crystalloid  Specimens: 1. Prostate and seminal vesicles 2. Right pelvic lymph nodes 3. Left pelvic lymph nodes  Disposition of specimens: Pathology  Drains: 1. 20 Fr coude catheter 2. # 19 Blake pelvic drain  Indication: Brendan Anderson is a 64 y.o. patient with clinically localized prostate cancer.  After a thorough review of the management options for treatment of prostate cancer, he elected to proceed with surgical therapy and the above procedure(s).  We have discussed the potential benefits and risks of the procedure, side effects of the proposed treatment, the likelihood of the patient achieving the goals of the procedure, and any potential problems that might occur during the procedure or recuperation. Informed consent has been obtained.  Description of procedure:  The patient was taken to the operating room and a general anesthetic was administered. He was given preoperative antibiotics, placed in the dorsal lithotomy position, and prepped and draped in the usual sterile fashion. Next a preoperative timeout was performed. A urethral catheter was placed into the bladder and a site was selected near the umbilicus for placement of  the camera port. This was placed using a standard open Hassan technique which allowed entry into the peritoneal cavity under direct vision and without difficulty. An 8 mm port was placed and a pneumoperitoneum established. The camera was then used to inspect the abdomen and there was no evidence of any intra-abdominal injuries or other abnormalities. The remaining abdominal ports were then placed. 8 mm robotic ports were placed in the right lower quadrant, left lower quadrant, and far left lateral abdominal wall. A 5 mm port was placed in the right upper quadrant and a 12 mm port was placed in the right lateral abdominal wall for laparoscopic assistance. All ports were placed under direct vision without difficulty. The surgical cart was then docked.   Utilizing the cautery scissors, the bladder was reflected posteriorly allowing entry into the space of Retzius and identification of the endopelvic fascia and prostate. The periprostatic fat was then removed from the prostate allowing full exposure of the endopelvic fascia. The endopelvic fascia was then incised from the apex back to the base of the prostate bilaterally and the underlying levator muscle fibers were swept laterally off the prostate thereby isolating the dorsal venous complex. The dorsal vein was then stapled and divided with a 45 mm Flex Echelon stapler. Attention then turned to the bladder neck which was divided anteriorly thereby allowing entry into the bladder and exposure of the urethral catheter. The catheter balloon was deflated and the catheter was brought into the operative field and used to retract the prostate anteriorly. The posterior bladder neck was then examined and was divided allowing further dissection between the bladder and prostate posteriorly until the vasa deferentia and seminal vessels were identified. The vasa deferentia were isolated, divided, and lifted anteriorly. The seminal vesicles  were dissected down to their tips with  care to control the seminal vascular arterial blood supply. These structures were then lifted anteriorly and the space between Denonvillier's fascia and the anterior rectum was developed with a combination of sharp and blunt dissection. This isolated the vascular pedicles of the prostate.  The lateral prostatic fascia on the left side of the prostate was then sharply incised allowing release of the neurovascular bundle. The vascular pedicle of the prostate on the left side was then ligated with Weck clips between the prostate and neurovascular bundle and divided with sharp cold scissor dissection resulting in neurovascular bundle preservation. On the right side, a wide non nerve sparing dissection was performed with Weck clips used to ligate the vascular pedicle of the prostate. The neurovascular bundle on the left side was then separated off the apex of the prostate and urethra.  The urethra was then sharply transected allowing the prostate specimen to be disarticulated. The pelvis was copiously irrigated and hemostasis was ensured. There was no evidence for rectal injury.  Attention then turned to the right pelvic sidewall. The fibrofatty tissue between the external iliac vein, confluence of the iliac vessels, hypogastric artery, and Cooper's ligament was dissected free from the pelvic sidewall with care to preserve the obturator nerve. Weck clips were used for lymphostasis and hemostasis. An identical procedure was performed on the contralateral side and the lymphatic packets were removed for permanent pathologic analysis.  Attention then turned to the urethral anastomosis. A 2-0 Vicryl slip knot was placed between Denonvillier's fascia, the posterior bladder neck, and the posterior urethra to reapproximate these structures. A double-armed 3-0 Monocryl suture was then used to perform a 360 running tension-free anastomosis between the bladder neck and urethra. A new urethral catheter was then placed into  the bladder and irrigated. There were no blood clots within the bladder and the anastomosis appeared to be watertight. A #19 Blake drain was then brought through the left lateral 8 mm port site and positioned appropriately within the pelvis. It was secured to the skin with a nylon suture. The surgical cart was then undocked. The right lateral 12 mm port site was closed at the fascial level with a 0 Vicryl suture placed laparoscopically. All remaining ports were then removed under direct vision. The prostate specimen was removed intact within the Endopouch retrieval bag via the periumbilical camera port site. This fascial opening was closed with two running 0 Vicryl sutures. 0.25% Marcaine was then injected into all port sites and all incisions were reapproximated at the skin level with 4-0 Monocryl subcuticular sutures and Dermabond. The patient appeared to tolerate the procedure well and without complications. The patient was able to be extubated and transferred to the recovery unit in satisfactory condition.   Pryor Curia MD

## 2019-06-27 NOTE — Interval H&P Note (Signed)
History and Physical Interval Note:  06/27/2019 10:11 AM  Brendan Anderson  has presented today for surgery, with the diagnosis of PROSTATE CANCER.  The various methods of treatment have been discussed with the patient and family. After consideration of risks, benefits and other options for treatment, the patient has consented to  Procedure(s): XI ROBOTIC ASSISTED LAPAROSCOPIC RADICAL PROSTATECTOMY LEVEL 2 (N/A) LYMPHADENECTOMY, PELVIC (Bilateral) as a surgical intervention.  The patient's history has been reviewed, patient examined, no change in status, stable for surgery.  I have reviewed the patient's chart and labs.  Questions were answered to the patient's satisfaction.     [employer]

## 2019-06-27 NOTE — Transfer of Care (Signed)
Immediate Anesthesia Transfer of Care Note  Patient: Brendan Anderson  Procedure(s) Performed: XI ROBOTIC ASSISTED LAPAROSCOPIC RADICAL PROSTATECTOMY LEVEL 2 (N/A ) LYMPHADENECTOMY, PELVIC (Bilateral )  Patient Location: PACU  Anesthesia Type:General  Level of Consciousness: drowsy  Airway & Oxygen Therapy: Patient Spontanous Breathing and Patient connected to face mask oxygen  Post-op Assessment: Report given to RN and Post -op Vital signs reviewed and stable  Post vital signs: Reviewed and stable  Last Vitals:  Vitals Value Taken Time  BP 144/89 06/27/19 1342  Temp    Pulse 84 06/27/19 1345  Resp 15 06/27/19 1345  SpO2 100 % 06/27/19 1345  Vitals shown include unvalidated device data.  Last Pain:  Vitals:   06/27/19 0951  TempSrc:   PainSc: 0-No pain         Complications: No apparent anesthesia complications

## 2019-06-28 ENCOUNTER — Encounter: Payer: Self-pay | Admitting: *Deleted

## 2019-06-28 DIAGNOSIS — C61 Malignant neoplasm of prostate: Secondary | ICD-10-CM | POA: Diagnosis not present

## 2019-06-28 LAB — HEMOGLOBIN AND HEMATOCRIT, BLOOD
HCT: 34.1 % — ABNORMAL LOW (ref 39.0–52.0)
HCT: 33.8 % — ABNORMAL LOW (ref 39.0–52.0)
Hemoglobin: 11.3 g/dL — ABNORMAL LOW (ref 13.0–17.0)
Hemoglobin: 11.1 g/dL — ABNORMAL LOW (ref 13.0–17.0)

## 2019-06-28 MED ORDER — BISACODYL 10 MG RE SUPP
10.0000 mg | Freq: Once | RECTAL | Status: AC
  Administered 2019-06-28: 10 mg via RECTAL
  Filled 2019-06-28: qty 1

## 2019-06-28 MED ORDER — TRAMADOL HCL 50 MG PO TABS: 50.0000 mg | ORAL_TABLET | Freq: Four times a day (QID) | ORAL | Status: DC | PRN

## 2019-06-28 MED ORDER — CHLORHEXIDINE GLUCONATE CLOTH 2 % EX PADS
6.0000 | MEDICATED_PAD | Freq: Every day | CUTANEOUS | Status: DC
  Administered 2019-06-28: 6 via TOPICAL

## 2019-06-28 NOTE — Progress Notes (Signed)
Patient ID: Brendan Anderson, male   DOB: [birth year], 64 y.o.   MRN: [id number]  1 Day Post-Op Subjective: The patient is doing well.  No nausea or vomiting. Pain is adequately controlled.  Objective: Vital signs in last 24 hours: Temp:  [97.8 F (36.6 C)-98.8 F (37.1 C)] 98.4 F (36.9 C) (04/16 0617) Pulse Rate:  [69-94] 77 (04/16 0617) Resp:  [13-22] 18 (04/16 0553) BP: (103-162)/(61-90) 103/77 (04/16 0617) SpO2:  [96 %-100 %] 96 % (04/16 0617) Weight:  [78.6 kg] 78.6 kg (04/15 0948)  Intake/Output from previous day: 04/15 0701 - 04/16 0700 In: 2870 [P.O.:120; I.V.:2400; IV Piggyback:350] Out: 2185 [Urine:1800; Drains:135; Blood:250] Intake/Output this shift: No intake/output data recorded.  Physical Exam:  General: Alert and oriented. CV: RRR Lungs: Clear bilaterally. GI: Soft, Nondistended. Incisions: Clean, dry, and intact Urine: Clear Extremities: Nontender, no erythema, no edema.  Lab Results: Recent Labs    06/27/19 1400 06/28/19 0456  HGB 13.9 11.3*  HCT 42.6 34.1*      Assessment/Plan: POD# 1 s/p robotic prostatectomy.  1) SL IVF 2) Ambulate, Incentive spirometry 3) Transition to oral pain medication 4) Dulcolax suppository 5) D/C pelvic drain 6) Plan for likely discharge later today   Brendan Anderson. MD   LOS: 0 days   Dutch Gray 06/28/2019, 7:39 AM

## 2019-06-28 NOTE — Discharge Summary (Signed)
  Date of admission: 06/27/2019  Date of discharge: 06/28/2019  Admission diagnosis: Prostate Cancer  Discharge diagnosis: Prostate Cancer  History and Physical: For full details, please see admission history and physical. Briefly, Brendan Anderson is a 64 y.o. gentleman with localized prostate cancer.  After discussing management/treatment options, he elected to proceed with surgical treatment.  Hospital Course: Brendan Anderson was taken to the operating room on 06/27/2019 and underwent a robotic assisted laparoscopic radical prostatectomy. He tolerated this procedure well and without complications. Postoperatively, he was able to be transferred to a regular hospital room following recovery from anesthesia.  He was able to begin ambulating the night of surgery. He remained hemodynamically stable overnight.  He had excellent urine output with appropriately minimal output from his pelvic drain and his pelvic drain was removed on POD #1.  He was transitioned to oral pain medication, tolerated a clear liquid diet, and had met all discharge criteria and was able to be discharged home later on POD#1.  Laboratory values:  Recent Labs    06/27/19 1400 06/28/19 0456 06/28/19 1049  HGB 13.9 11.3* 11.1*  HCT 42.6 34.1* 33.8*    Disposition: Home  Discharge instruction: He was instructed to be ambulatory but to refrain from heavy lifting, strenuous activity, or driving. He was instructed on urethral catheter care.  Discharge medications:   Allergies as of 06/28/2019      Reactions   Codeine Other (See Comments)   "MAKES HIM HYPER"      Medication List    STOP taking these medications   multivitamin with minerals Tabs tablet     TAKE these medications   esomeprazole 40 MG capsule Commonly known as: NEXIUM Take 40 mg by mouth daily before breakfast.   GAVISCON-2 PO Take 2 tablets by mouth at bedtime.   sildenafil 100 MG tablet Commonly known as: VIAGRA Take 25 mg by mouth daily as  needed for erectile dysfunction.   sulfamethoxazole-trimethoprim 800-160 MG tablet Commonly known as: BACTRIM DS Take 1 tablet by mouth 2 (two) times daily. Start the day prior to foley removal appointment   traMADol 50 MG tablet Commonly known as: Ultram Take 1-2 tablets (50-100 mg total) by mouth every 6 (six) hours as needed for moderate pain or severe pain.   traZODone 100 MG tablet Commonly known as: DESYREL Take 100 mg by mouth at bedtime.   ZyrTEC Allergy 10 MG tablet Generic drug: cetirizine Take 10 mg by mouth daily.       Followup: He will followup in 1 week for catheter removal and to discuss his surgical pathology results.

## 2019-06-28 NOTE — Progress Notes (Signed)
Pt to be discharged to home this afternoon. Pt and Pt's Wife given discharge teaching and all Medications and schedules reviewed with Pt and Pt's Wife. Home teaching regarding foley catheter care and converting to leg bag demonstrated for both Pt and Pt's Wife. Understanding verbalized of al discharge teaching. Discharge packet with Pt at time of discharge

## 2019-06-29 NOTE — Anesthesia Postprocedure Evaluation (Signed)
Anesthesia Post Note  Patient: Brendan Anderson  Procedure(s) Performed: XI ROBOTIC ASSISTED LAPAROSCOPIC RADICAL PROSTATECTOMY LEVEL 2 (N/A ) LYMPHADENECTOMY, PELVIC (Bilateral )     Patient location during evaluation: PACU Anesthesia Type: General Level of consciousness: sedated Pain management: pain level controlled Vital Signs Assessment: post-procedure vital signs reviewed and stable Respiratory status: spontaneous breathing and respiratory function stable Cardiovascular status: stable Postop Assessment: no apparent nausea or vomiting Anesthetic complications: no                  Marializ Ferrebee DANIEL

## 2019-07-03 LAB — SURGICAL PATHOLOGY

## 2019-10-25 ENCOUNTER — Ambulatory Visit: Payer: Medicare Other | Admitting: Radiation Oncology

## 2019-10-25 DIAGNOSIS — I700 Atherosclerosis of aorta: Secondary | ICD-10-CM | POA: Insufficient documentation

## 2019-10-29 ENCOUNTER — Encounter: Payer: Self-pay | Admitting: Medical Oncology

## 2019-10-31 ENCOUNTER — Encounter: Payer: Self-pay | Admitting: Medical Oncology

## 2019-10-31 NOTE — Progress Notes (Signed)
Left a message requesting a return call to discuss referral to the [facility] 8/31. I mailed packet of information on the clinic and medical forms to patient.

## 2019-11-04 ENCOUNTER — Encounter: Payer: Self-pay | Admitting: Medical Oncology

## 2019-11-04 NOTE — Progress Notes (Signed)
Pt returned call regarding [facility] referral.   I introduced myself as the Prostate Nurse Navigator and the Coordinator of the [facility] [place].  1. I confirmed with the patient he is aware of his referral to the clinic 8/31, arriving at 12:30pm.   2. I discussed the format of the clinic and the physicians he will be seeing that day.  3. I discussed where the clinic is located and how to contact me. I reviewed [place] parking, registration and COVID protocol.   4. I confirmed he received packet of information and forms to be completed. I asked him to bring them with him the day of his appointment.   He voiced understanding of the above. I asked him to call me if he has any questions or concerns regarding his appointments or the forms he needs to complete.

## 2019-11-11 ENCOUNTER — Encounter: Payer: Self-pay | Admitting: Medical Oncology

## 2019-11-11 NOTE — Progress Notes (Signed)
Left message to confirm appointment for [facility] 8/31, arriving @ 12:30 pm. I reviewed location, valet parking, registration and COVID protocol. I reminded him to bring his completed medical forms and to have lunch prior to arrival.

## 2019-11-12 ENCOUNTER — Encounter: Payer: Self-pay | Admitting: General Practice

## 2019-11-12 ENCOUNTER — Encounter: Payer: Self-pay | Admitting: Medical Oncology

## 2019-11-12 ENCOUNTER — Ambulatory Visit
Admission: RE | Admit: 2019-11-12 | Discharge: 2019-11-12 | Disposition: A | Discharge status: Home / Self Care | Payer: Medicare Other | Source: Ambulatory Visit | Attending: Radiation Oncology | Admitting: Radiation Oncology

## 2019-11-12 ENCOUNTER — Other Ambulatory Visit: Payer: Self-pay

## 2019-11-12 ENCOUNTER — Encounter: Payer: Self-pay | Admitting: Radiation Oncology

## 2019-11-12 ENCOUNTER — Inpatient Hospital Stay: Payer: Medicare Other | Attending: Oncology | Admitting: Oncology

## 2019-11-12 VITALS — BP 138/86 | HR 77 | Temp 98.7°F | Resp 18 | Ht 69.0 in | Wt 177.2 lb

## 2019-11-12 DIAGNOSIS — J309 Allergic rhinitis, unspecified: Secondary | ICD-10-CM | POA: Insufficient documentation

## 2019-11-12 DIAGNOSIS — J329 Chronic sinusitis, unspecified: Secondary | ICD-10-CM | POA: Insufficient documentation

## 2019-11-12 DIAGNOSIS — Z87891 Personal history of nicotine dependence: Secondary | ICD-10-CM | POA: Diagnosis not present

## 2019-11-12 DIAGNOSIS — C775 Secondary and unspecified malignant neoplasm of intrapelvic lymph nodes: Secondary | ICD-10-CM

## 2019-11-12 DIAGNOSIS — C61 Malignant neoplasm of prostate: Secondary | ICD-10-CM | POA: Insufficient documentation

## 2019-11-12 HISTORY — DX: Malignant neoplasm of prostate: C61

## 2019-11-12 HISTORY — DX: Unspecified malignant neoplasm of skin, unspecified: C44.90

## 2019-11-12 NOTE — Progress Notes (Signed)
Radiation Oncology         [phone] ________________________________  Multidisciplinary Prostate Cancer Clinic  Initial Radiation Oncology Consultation  Name: Brendan Anderson MRN: [id number]  Date: 11/12/2019  DOB: May 15, 1955  OE:VOJJK, Brendan Dibbles, MD  Brendan Bring, MD   REFERRING PHYSICIAN: Raynelle Bring, MD  DIAGNOSIS: 64 y.o. gentleman with a rising, detectable postoperative PSA of 0.38 s/p RALP in 06/2019 for stage pT3b, pN1, Gleason 5+4 prostate cancer    ICD-10-CM   1. Prostate CA ([place])  C61   2. Prostate cancer metastatic to intrapelvic lymph node ([place])  C61    C77.5     HISTORY OF PRESENT ILLNESS::Brendan Anderson is a 64 y.o. gentleman.  He was initially diagnosed with Gleason 4+5 adenocarcinoma of the prostate on biopsy with Dr. Lerry Anderson, in [place], [place], with a PSA of 6.3.   Initial biopsy in [place]:   He was referred to Dr. Alinda Anderson at [facility] in consideration for robotic prostatectomy.  He underwent staging scans here in [place] on 06/03/2019 with CT A/P and bone scan both negative for evidence of visceral or osseous metastatic disease.  He elected to proceed with RALP with BPLND on 06/27/2019 under the care of Dr. Alinda Anderson.  Final surgical pathology revealed pT3bN1, Gleason 5+4,  prostatic adenocarcinoma with extraprostatic extension present at right posterior mid, bladder neck, and bilateral posterior base as well as bilateral seminal vesicle invasion and lymphovascular invasion present.  Surgical margins were negative but one out of nine (four right and five left) sampled pelvic lymph nodes were positive for metastatic adenocarcinoma, measuring 1.0 cm with extranodal extension (1/9).  From prostatectomy 06/27/19:   Unfortunately, his postoperative PSA on 09/19/2019 was detectable at 0.2 and his most recent PSA performed on 10/29/2019 was further elevated at 0.38.  The patient reviewed the surgical pathology and PSA results with his urologist and he  has kindly been referred today to the multidisciplinary prostate cancer clinic for presentation of pathology and radiology studies in our conference for discussion of potential radiation treatment options and clinical evaluation.  PREVIOUS RADIATION THERAPY: No  PAST MEDICAL HISTORY:  has a past medical history of Anxiety, Arthritis, Depression, GERD (gastroesophageal reflux disease), Prostate cancer ([place]), and Skin cancer.    PAST SURGICAL HISTORY: Past Surgical History:  Procedure Laterality Date  . BICEPS TENDON REPAIR Left 07/2016  . EYE SURGERY Bilateral 2005  . LYMPHADENECTOMY Bilateral 06/27/2019   Procedure: LYMPHADENECTOMY, PELVIC;  Surgeon: Brendan Bring, MD;  Location: [facility];  Service: Urology;  Laterality: Bilateral;  . MOLE REMOVAL  2008   pre CA from face  . NASAL SINUS SURGERY  2010  . ROBOT ASSISTED LAPAROSCOPIC RADICAL PROSTATECTOMY N/A 06/27/2019   Procedure: XI ROBOTIC ASSISTED LAPAROSCOPIC RADICAL PROSTATECTOMY LEVEL 2;  Surgeon: Brendan Bring, MD;  Location: [facility];  Service: Urology;  Laterality: N/A;  . SHOULDER ARTHROSCOPY WITH ROTATOR CUFF REPAIR Left 11/2016    FAMILY HISTORY: family history includes Brendan Anderson in his mother.  SOCIAL HISTORY:  reports that he quit smoking about 36 years ago. His smoking use included cigars. He has a 2.50 pack-year smoking history. He has never used smokeless tobacco. He reports current alcohol use. He reports that he does not use drugs.  ALLERGIES: Codeine  MEDICATIONS:  Current Outpatient Medications  Medication Sig Dispense Refill  . Al Hyd-Mg Tr-Alg Ac-Sod Bicarb (GAVISCON-2 PO) Take 2 tablets by mouth at bedtime.    . cetirizine (ZYRTEC ALLERGY) 10 MG tablet Take 10 mg by mouth daily.    Marland Kitchen  esomeprazole (NEXIUM) 40 MG capsule Take 1 capsule by mouth daily.    . Multiple Vitamin (MULTIVITAMIN) tablet Take 1 tablet by mouth daily.    . sildenafil (VIAGRA) 100 MG tablet Take 25 mg by mouth daily as needed for erectile  dysfunction.    . traZODone (DESYREL) 100 MG tablet Take 100 mg by mouth at bedtime.     No current facility-administered medications for this encounter.    REVIEW OF SYSTEMS:  On review of systems, the patient reports that he is doing well overall. He denies any chest pain, shortness of breath, cough, fevers, chills, night sweats, unintended weight changes. He denies any bowel disturbances, and denies abdominal pain, nausea or vomiting. He denies any new musculoskeletal or joint aches or pains. His IPSS was 2, indicating minimal urinary symptoms with nocturia x1 and urinary frequency.  He does continue with mild urinary incontinence postoperatively, wearing approximately 2 pads per day for protection.  His SHIM was 13, indicating he has mild to moderate erectile dysfunction. A complete review of systems is obtained and is otherwise negative.   PHYSICAL EXAM:  Wt Readings from Last 3 Encounters:  11/12/19 177 lb 3.2 oz (80.4 kg)  06/27/19 173 lb 4.5 oz (78.6 kg)  06/19/19 173 lb 4 oz (78.6 kg)   Temp Readings from Last 3 Encounters:  11/12/19 98.7 F (37.1 C)  06/28/19 98.4 F (36.9 C) (Oral)  06/19/19 98.7 F (37.1 C) (Oral)   BP Readings from Last 3 Encounters:  11/12/19 138/86  06/28/19 103/77  06/19/19 133/83   Pulse Readings from Last 3 Encounters:  11/12/19 77  06/28/19 77  06/19/19 74   Pain Assessment Pain Score: 0-No pain/10  In general this is a well appearing Caucasian male in no acute distress. He is alert and oriented x4 and appropriate throughout the examination. HEENT reveals that the patient is normocephalic, atraumatic. EOMs are intact. PERRLA. Skin is intact without any evidence of gross lesions. Cardiopulmonary assessment is negative for acute distress and he exhibits normal effort. The abdomen is soft, non tender, non distended. Lower extremities are negative for pretibial pitting edema, deep calf tenderness, cyanosis or clubbing.  KPS = 100  100 - Normal;  no complaints; no evidence of disease. 90   - Able to carry on normal activity; minor signs or symptoms of disease. 80   - Normal activity with effort; some signs or symptoms of disease. 36   - Cares for self; unable to carry on normal activity or to do active work. 60   - Requires occasional assistance, but is able to care for most of his personal needs. 50   - Requires considerable assistance and frequent medical care. 75   - Disabled; requires special care and assistance. 37   - Severely disabled; hospital admission is indicated although death not imminent. 59   - Very sick; hospital admission necessary; active supportive treatment necessary. 10   - Moribund; fatal processes progressing rapidly. 0     - Dead  Karnofsky DA, Abelmann [place], Craver LS and Burchenal [facility] [phone]) The use of the nitrogen mustards in the palliative treatment of carcinoma: with particular reference to bronchogenic carcinoma Cancer 1 634-56   LABORATORY DATA:  Lab Results  Component Value Date   WBC 5.3 06/19/2019   HGB 11.1 (L) 06/28/2019   HCT 33.8 (L) 06/28/2019   MCV 90.5 06/19/2019   PLT 185 06/19/2019   Lab Results  Component Value Date   NA 141 06/19/2019  K 4.1 06/19/2019   CL 106 06/19/2019   CO2 26 06/19/2019   No results found for: ALT, AST, GGT, ALKPHOS, BILITOT   RADIOGRAPHY: No results found.    IMPRESSION/PLAN: 64 y.o. gentleman with a rising, detectable postoperative PSA of 0.38 s/p RALP in 06/2019 for stage pT3b, pN1, Gleason 5+4 prostate cancer.  Today we reviewed the findings and workup thus far.  We discussed the natural history of prostate cancer.  We reviewed the the implications of positive margins, extracapsular extension, and seminal vesicle involvement on the risk of prostate cancer recurrence, particularly in light of detectable, rising postoperative PSA. We reviewed some of the evidence suggesting an advantage for patients who undergo salvage radiotherapy in this setting in terms  of disease control and overall survival. We discussed radiation treatment directed to the prostatic fossa and pelvic lymph nodes with regard to the logistics and delivery of a 7-1/2-week course of daily external beam radiation treatments. We also detailed the role of ADT in the setting of high risk prostate cancer and outlined the associated side effects that could be expected with this therapy.  We reviewed studies that show that extending radiation therapy to the pelvic lymph nodes combined with adding short-term hormone therapy to standard treatment of the prostate surgical bed, in the setting of high risk prostate cancer with adverse pathology findings, can extend the amount of time before disease progression as well as improved overall survival benefit.   He was encouraged to ask questions that were answered to his stated satisfaction.  He appears to have a good understanding of his disease and our treatment recommendations which are of curative intent.  At the end of the conversation, the patient is interested in moving forward with salvage radiotherapy to the prostate fossa, concurrent with ST-ADT.  He will also meet with Dr. Alinda Anderson and Dr. Alen Blew as part of today's cancer conference to ascertain their opinion and pending we are all in agreement, he will proceed with starting ADT in the near future, prior to CT simulation which is tentatively scheduled for 11/20/2019 at 10 AM.  He has freely signed written consent to proceed today in the office and a copy of this document will be placed in his medical record.  We enjoyed meeting him today and look forward to continuing to participate in his care.     Nicholos Johns, PA-C    Tyler Pita, MD  [facility] Direct Dial: [phone]  Fax: [phone] [email or web address]  Skype  [email or web address]   This document serves as a record of services personally performed by Tyler Pita, MD and Freeman Caldron, PA-C. It was created on their  behalf by Wilburn Mylar, a trained medical scribe. The creation of this record is based on the scribe's personal observations and the provider's statements to them. This document has been checked and approved by the attending provider.

## 2019-11-12 NOTE — Consult Note (Signed)
Multi-Disciplinary Clinic 11/12/2019    Finas Delone         MRN: [id number] PRIMARY CARE:  Eber Hong   REFERRING:  Aloha Gell. Hurt, MD   PROVIDER:  Mertha Finders, PT  DOB: [birth year], 64 year old Male TREATING:  Raynelle Bring, M.D.  SSN:  LOCATION:  [facility] [phone]    CC/HPI: CC: Prostate Cancer   PCP: Dr. Eber Hong  Location of consult: [facility] - Prostate Cancer Multidisciplinary Clinic   Mr. Lorentz is a 64 year old gentleman who was found to have an elevated PSA of 6.3 prompting a TRUS biopsy of the prostate on 04/23/19. 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 CT imaging of the abdomen and pelvis and whole body bone scan imaging was performed on 06/03/19 and did not indicate evidence of metastatic disease. He proceeded with surgical therapy and underwent a UNS RAL radical prostatectomy and BPLND on 06/27/19. Pathology indicated a pT3b N1 Mx, Gleason 5+4=9 adenocarcinoma with negative surgical margins. 1 out of 9 lymph nodes was positive for malignancy. His PSA was 0.20 on 09/19/19 and was further increased to 0.38 when checked prior to this visit on 10/29/19.   He has regained his continence recently according to physical therapy and he confirms this today.   Family history: None.   PMH: He has a history of laryngopharyngeal reflux, depression, and arthritis.  PSH: Robotic prostatectomy and BPLND.     ALLERGIES: Codeine    MEDICATIONS: Sildenafil Citrate 100 mg tablet 1 tablet PO as directed PRN  Allergy  Esomeprazole Magnesium  Gaviscon  Multiple Vitamin  Sildenafil Citrate 100 mg tablet  Trazodone Hcl     GU PSH: Laparoscopy; Lymphadenectomy - 06/27/2019 Locm 300-399Mg /Ml Iodine,1Ml - 06/03/2019 Robotic Radical Prostatectomy - 06/27/2019     NON-GU PSH: Cataract surgery Elbow Surgery (Unspecified) Rotator cuff surgery Sinus Surgery Procedure         GU PMH:  Stress Incontinence - 10/29/2019, - 09/19/2019, - 09/03/2019, - 08/09/2019, - 07/25/2019, - 07/02/2019 ED due to arterial insufficiency - 07/02/2019 Prostate Cancer - 05/21/2019      PMH Notes:   1) Prostate cancer: He is s/p a UNS RAL radical prostatectomy and BPLND on 06/27/19.   Diagnosis: pT3b N1 Mx, Gleason 5+4=9 adenocarcinoma with negative surgical margins (1/9 lymph nodes)  Pretreatment PSA: 6.3  Pretreatment SHIM score: 13   NON-GU PMH: Muscle weakness (generalized) - 10/29/2019, - 09/19/2019 Other muscle spasm - 10/29/2019, - 09/19/2019 Arthritis GERD    FAMILY HISTORY: Breast Cancer - Mother    Notes: 1 son, 1 daughter   SOCIAL HISTORY: Marital Status: Married Preferred Language: English; Ethnicity: Not Hispanic Or Latino; Race: White Current Smoking Status: Patient does not smoke anymore. Has not smoked since 05/12/2009. Smoked for 40 years. Smoked 1 pack per day.  <DIV'  Tobacco Use Assessment Completed:  Used Tobacco in last 30 days?   Does not drink anymore.  Does not drink caffeine.    REVIEW OF SYSTEMS:    GU Review Male:  Patient denies frequent urination, hard to postpone urination, burning/ pain with urination, get up at night to urinate, leakage of urine, stream starts and stops, trouble starting your streams, and have to strain to urinate .   Gastrointestinal (Lower):  Patient denies diarrhea and constipation.   Gastrointestinal (Upper):  Patient denies nausea and vomiting.   Constitutional:  Patient denies fever, night sweats, weight  loss, and fatigue.   Skin:  Patient denies skin rash/ lesion and itching.   Eyes:  Patient denies blurred vision and double vision.   Ears/ Nose/ Throat:  Patient denies sore throat and sinus problems.   Hematologic/Lymphatic:  Patient denies easy bruising and swollen glands.   Cardiovascular:  Patient denies leg swelling and chest pains.   Respiratory:  Patient denies cough and shortness of breath.   Endocrine:  Patient denies excessive  thirst.   Musculoskeletal:  Patient denies back pain and joint pain.   Neurological:  Patient denies headaches and dizziness.   Psychologic:  Patient denies depression and anxiety.   VITAL SIGNS: None    MULTI-SYSTEM PHYSICAL EXAMINATION:     Constitutional: Well-nourished. No physical deformities. Normally developed. Good grooming.          Complexity of Data:   Lab Test Review:  PSA  Records Review:  Pathology Reports, Previous Patient Records  X-Ray Review: C.T. Abdomen/Pelvis: Reviewed Films.  Bone Scan: Reviewed Films.      10/29/19 09/19/19  PSA  Total PSA 0.38 ng/mL 0.20 ng/mL    PROCEDURES: None   ASSESSMENT:     ICD-10 Details  1 GU:  Prostate Cancer - C61    PLAN:   Document  Letter(s):  Created for Patient: Clinical Summary   Notes:  1. Prostate cancer: We reviewed his PSA which continues to increase. He has regained full continence. We reviewed options and he does wish to proceed with salvage radiation therapy and short-term androgen deprivation therapy for 6 months. He has been thoroughly counseled by Dr. Alen Blew and Dr. Tammi Klippel earlier this afternoon and feels very comfortable with this approach. We reviewed the potential side effects of androgen deprivation therapy knee is prepared for these. We have also reviewed the potential risks and side effects of salvage radiation therapy. He will be scheduled to begin androgen deprivation and receive Eligard 45 mg as soon as possible. He is scheduled for his radiation planning next week and should complete therapy before Thanksgiving. I will plan to have him follow up with me in January or February to check his PSA at that time.   2. Incontinence: This has resolved.   3. Erectile dysfunction: He does not have a great chance of seeing spontaneous recovery. However, we will discuss this further after he completes androgen deprivation and assuming that his libido improves.   Cc: Dr. Eber Hong  Dr. Zola Button  Dr.  Tyler Pita   E & M CODES: We spent 24 minutes dedicated to evaluation and management time, including face to face interaction, discussions on coordination of care, documentation, result review, and discussion with others as applicable.

## 2019-11-12 NOTE — Progress Notes (Signed)
                               Care Plan Summary  Name: Mr. Brendan Anderson DOB: 30-Jun-1955   Your Medical Team:   Urologist -  Dr. Raynelle Bring, [facility]  Radiation Oncologist - Dr. Tyler Pita, [facility]   Medical Oncologist - Dr. Zola Button, [place]  Recommendations: 1) Androgen deprivation (hormone injection) 2) Radiation    * These recommendations are based on information available as of today's consult.      Recommendations may change depending on the results of further tests or exams.    Next Steps: 1) Dr. Alinda Money schedule hormone injection  2) CT simulation 9/9 at [facility]   When appointments need to be scheduled, you will be contacted by [facility] and/or [facility].  Questions?  Please do not hesitate to call Cira Rue, RN, BSN, OCN at [phone] any questions or concerns.  Shirlean Mylar is your Oncology Nurse Navigator and is available to assist you while you're receiving your medical care at [facility].

## 2019-11-12 NOTE — Progress Notes (Signed)
[place] Psychosocial Distress Screening Spiritual Care  Met with Brendan Anderson and his wife Brendan Anderson in [facility] to introduce [place] team/resources, reviewing distress screen per protocol.  The patient scored a 1 on the Psychosocial Distress Thermometer which indicates mild distress. Also assessed for distress and other psychosocial needs.   ONCBCN DISTRESS SCREENING 11/12/2019  Screening Type Initial Screening  Distress experienced in past week (1-10) 1  Emotional problem type Adjusting to illness  Information Concerns Type Lack of info about treatment  Referral to support programs Yes   [name] takes great comfort in his faith and prayer, reporting good support from his church community. He reports low distress and that he feels well cared for by his team. Provided empathic listening, normalization of feelings, pastoral reflection, and affirmation of strengths.  Follow up needed: No. Per couple, no other needs or concerns at this time, but they know to reach out to Team if needs arise or circumstances change.   [place], [place], [facility] Pager [phone] Voicemail [phone]

## 2019-11-12 NOTE — Progress Notes (Signed)
GU Location of Tumor / Histology: Biochemical recurrent prostate cancer s/p radical prostatectomy with positive lymph nodes.  If Prostate Cancer, Gleason Score is (5 + 4) and PSA is (6.3)  Melonie Florida had a robotic radical prostatectomy with lymphadenectomy on 06/27/2019. PSA collected on 09/19/2019 approximately 3 months s/p prostatectomy proved to be 0.20.     Past/Anticipated interventions by urology, if any: prostate biopsy, prostatectomy, referral to [facility]  Past/Anticipated interventions by medical oncology, if any: no  Weight changes, if any: no  Bowel/Bladder complaints, if any: IPSS: 2. SHIM: 13. Using between 2-3 pads per day for 24 hours to manage incontinence s/p prostatectomy. No attempts have been made to achieve an erection.    Nausea/Vomiting, if any: no  Pain issues, if any:  denies  SAFETY ISSUES:  Prior radiation? denies  Pacemaker/ICD? denies  Possible current pregnancy? no, male patient  Is the patient on methotrexate? denies  Current Complaints / other details: 64 year old male. Married with one son and one daughter. Resides in [place], [place]. Marland Kitchen

## 2019-11-12 NOTE — Progress Notes (Signed)
Reason for the request: Prostate cancer  HPI: I was asked by Dr. Alinda Money to evaluate Brendan Anderson for the evaluation of prostate cancer.  He is a 64 year old man with no significant comorbid conditions diagnosed with prostate cancer February 21.  He was found to have a PSA of 6.3 and a biopsy performed months of [place] at that time showed a Gleason score 4+5 = 9 and 5 cores and Gleason score 3+3 equal 6.  He evaluated by Dr. Alinda Money underwent a radical prostatectomy on June 27, 2019.  Prior to that his staging work-up was negative.  The final pathology from his surgery showed a Gleason score 5+4 = 9 with negative margins with a 1 out of 9 lymph nodes were involved with extra lymphatic extension.  A final pathological staging was T3bN1.  Is follow-up PSA was 0.2 post surgery.  In October 29, 2019 his PSA was 0.38.  Based on these findings he was referred to the prostate cancer multidisciplinary clinic for discussion.  Clinically, he reports he has regained most of his continence at this time without any urological symptoms.  Continues to be active and attends to activities of daily living.  He does not report any headaches, blurry vision, syncope or seizures. Does not report any fevers, chills or sweats.  Does not report any cough, wheezing or hemoptysis.  Does not report any chest pain, palpitation, orthopnea or leg edema.  Does not report any nausea, vomiting or abdominal pain.  Does not report any constipation or diarrhea.  Does not report any skeletal complaints.    Does not report frequency, urgency or hematuria.  Does not report any skin rashes or lesions. Does not report any heat or cold intolerance.  Does not report any lymphadenopathy or petechiae.  Does not report any anxiety or depression.  Remaining review of systems is negative.    Past Medical History:  Diagnosis Date  . Anxiety   . Arthritis    hip knees wrist  . Depression   . GERD (gastroesophageal reflux disease)   . Prostate cancer  [facility])   :  Past Surgical History:  Procedure Laterality Date  . BICEPS TENDON REPAIR Left 07/2016  . EYE SURGERY Bilateral 2005  . LYMPHADENECTOMY Bilateral 06/27/2019   Procedure: LYMPHADENECTOMY, PELVIC;  Surgeon: Raynelle Bring, MD;  Location: [facility];  Service: Urology;  Laterality: Bilateral;  . MOLE REMOVAL  2008   pre CA from face  . NASAL SINUS SURGERY  2010  . ROBOT ASSISTED LAPAROSCOPIC RADICAL PROSTATECTOMY N/A 06/27/2019   Procedure: XI ROBOTIC ASSISTED LAPAROSCOPIC RADICAL PROSTATECTOMY LEVEL 2;  Surgeon: Raynelle Bring, MD;  Location: [facility];  Service: Urology;  Laterality: N/A;  . SHOULDER ARTHROSCOPY WITH ROTATOR CUFF REPAIR Left 11/2016  :   Current Outpatient Medications:  .  Al Hyd-Mg Tr-Alg Ac-Sod Bicarb (GAVISCON-2 PO), Take 2 tablets by mouth at bedtime., Disp: , Rfl:  .  cetirizine (ZYRTEC ALLERGY) 10 MG tablet, Take 10 mg by mouth daily., Disp: , Rfl:  .  esomeprazole (NEXIUM) 40 MG capsule, Take 40 mg by mouth daily before breakfast., Disp: , Rfl:  .  sildenafil (VIAGRA) 100 MG tablet, Take 25 mg by mouth daily as needed for erectile dysfunction., Disp: , Rfl:  .  sulfamethoxazole-trimethoprim (BACTRIM DS) 800-160 MG tablet, Take 1 tablet by mouth 2 (two) times daily. Start the day prior to foley removal appointment, Disp: 6 tablet, Rfl: 0 .  traMADol (ULTRAM) 50 MG tablet, Take 1-2 tablets (50-100 mg total) by  mouth every 6 (six) hours as needed for moderate pain or severe pain., Disp: 20 tablet, Rfl: 0 .  traZODone (DESYREL) 100 MG tablet, Take 100 mg by mouth at bedtime., Disp: , Rfl: :  Allergies  Allergen Reactions  . Codeine Other (See Comments)    "MAKES HIM HYPER"  :  Family History  Problem Relation Age of Onset  . Breast cancer Neg Hx   . Prostate cancer Neg Hx   . Colon cancer Neg Hx   . Pancreatic cancer Neg Hx   :  Social History   Socioeconomic History  . Marital status: Married    Spouse name: Not on file  . Number of children: Not on  file  . Years of education: Not on file  . Highest education level: Not on file  Occupational History  . Not on file  Tobacco Use  . Smoking status: Former Smoker    Packs/day: 0.25    Years: 10.00    Pack years: 2.50    Types: Cigars    Quit date: 06/19/1983    Years since quitting: 36.4  . Smokeless tobacco: Never Used  Vaping Use  . Vaping Use: Never used  Substance and Sexual Activity  . Alcohol use: Yes    Comment: rare  . Drug use: Never  . Sexual activity: Yes  Other Topics Concern  . Not on file  Social History Narrative  . Not on file   Social Determinants of Health   Financial Resource Strain:   . Difficulty of Paying Living Expenses: Not on file  Food Insecurity:   . Worried About Charity fundraiser in the Last Year: Not on file  . Ran Out of Food in the Last Year: Not on file  Transportation Needs:   . Lack of Transportation (Medical): Not on file  . Lack of Transportation (Non-Medical): Not on file  Physical Activity:   . Days of Exercise per Week: Not on file  . Minutes of Exercise per Session: Not on file  Stress:   . Feeling of Stress : Not on file  Social Connections:   . Frequency of Communication with Friends and Family: Not on file  . Frequency of Social Gatherings with Friends and Family: Not on file  . Attends Religious Services: Not on file  . Active Member of Clubs or Organizations: Not on file  . Attends Archivist Meetings: Not on file  . Marital Status: Not on file  Intimate Partner Violence:   . Fear of Current or Ex-Partner: Not on file  . Emotionally Abused: Not on file  . Physically Abused: Not on file  . Sexually Abused: Not on file  :  Pertinent items are noted in HPI.  Exam: ECOG 0 General appearance: alert and cooperative appeared without distress. Head: atraumatic without any abnormalities. Eyes: conjunctivae/corneas clear. PERRL.  Sclera anicteric. Throat: lips, mucosa, and tongue normal; without oral thrush  or ulcers. Resp: clear to auscultation bilaterally without rhonchi, wheezes or dullness to percussion. Cardio: regular rate and rhythm, S1, S2 normal, no murmur, click, rub or gallop GI: soft, non-tender; bowel sounds normal; no masses,  no organomegaly Skin: Skin color, texture, turgor normal. No rashes or lesions Lymph nodes: Cervical, supraclavicular, and axillary nodes normal. Neurologic: Grossly normal without any motor, sensory or deep tendon reflexes. Musculoskeletal: No joint deformity or effusion.    Assessment and Plan:    64 year old man with prostate cancer diagnosed in February 2021.  He has a  Gleason score 4+5 = 9.  He underwent radical prostatectomy and found to have final pathology T3BN1 with 1 out of 9 lymph nodes involved.  His postoperative PSA was 0.2 and in August 17 his PSA is 0.38.  His case was discussed today in the prostate cancer multidisciplinary clinic.  This included reviewing his imaging studies as well as pathology results by the reviewing pathologist.  Treatment options were discussed at this time.  Adjuvant radiation therapy would be reasonable at this time given his persistent PSA and extranodal disease.  The role of androgen deprivation therapy was also discussed and would be important given his high risk disease.  Complication associated with androgen deprivation reviewed include hot flashes, weight gain, sexual dysfunction.  Role for for additional systemic therapy were discussed at this time.  Options such as systemic chemotherapy with Taxotere, androgen synthesis inhibitor such as abiraterone among others were discussed.  At this time I prefer to defer these options unless he develops measurable metastatic disease in the future.  He will consider these options and likely will proceed with radiation therapy and 6 months of androgen deprivation and continued monitoring PSA after.   45  minutes were dedicated to this visit. The time was spent on reviewing  laboratory data, imaging studies, discussing treatment options,  and answering questions regarding future plan.     A copy of this consult has been forwarded to the requesting physician.

## 2019-11-14 ENCOUNTER — Encounter: Payer: Self-pay | Admitting: Medical Oncology

## 2019-11-14 ENCOUNTER — Other Ambulatory Visit: Payer: Self-pay | Admitting: Medical Oncology

## 2019-11-14 ENCOUNTER — Telehealth: Payer: Self-pay | Admitting: Genetic Counselor

## 2019-11-14 DIAGNOSIS — C61 Malignant neoplasm of prostate: Secondary | ICD-10-CM

## 2019-11-14 DIAGNOSIS — C775 Secondary and unspecified malignant neoplasm of intrapelvic lymph nodes: Secondary | ICD-10-CM

## 2019-11-14 NOTE — Progress Notes (Signed)
Spoke with patient to let him know he meets criteria for genetic testing. I explained what this mean and having a son and daughter, it will be beneficial for their future screenings. He states he is interested. I will place referral and he is aware he will be contacted by genetics for appointment.

## 2019-11-14 NOTE — Telephone Encounter (Signed)
Scheduled appt per 9/2 sch msg - left message with appt date and time

## 2019-11-20 ENCOUNTER — Other Ambulatory Visit: Payer: Self-pay

## 2019-11-20 ENCOUNTER — Ambulatory Visit
Admission: RE | Admit: 2019-11-20 | Discharge: 2019-11-20 | Disposition: A | Discharge status: Home / Self Care | Payer: Medicare Other | Source: Ambulatory Visit | Attending: Radiation Oncology | Admitting: Radiation Oncology

## 2019-11-20 DIAGNOSIS — Z510 Encounter for antineoplastic radiation therapy: Secondary | ICD-10-CM | POA: Diagnosis not present

## 2019-11-20 DIAGNOSIS — C61 Malignant neoplasm of prostate: Secondary | ICD-10-CM | POA: Insufficient documentation

## 2019-11-21 ENCOUNTER — Encounter: Payer: Self-pay | Admitting: Medical Oncology

## 2019-11-27 DIAGNOSIS — Z510 Encounter for antineoplastic radiation therapy: Secondary | ICD-10-CM | POA: Diagnosis not present

## 2019-12-02 ENCOUNTER — Encounter: Payer: Self-pay | Admitting: Medical Oncology

## 2019-12-02 ENCOUNTER — Other Ambulatory Visit: Payer: Self-pay

## 2019-12-02 ENCOUNTER — Ambulatory Visit
Admission: RE | Admit: 2019-12-02 | Discharge: 2019-12-02 | Disposition: A | Discharge status: Home / Self Care | Payer: Medicare Other | Source: Ambulatory Visit | Attending: Radiation Oncology | Admitting: Radiation Oncology

## 2019-12-02 DIAGNOSIS — Z510 Encounter for antineoplastic radiation therapy: Secondary | ICD-10-CM | POA: Diagnosis not present

## 2019-12-03 ENCOUNTER — Other Ambulatory Visit: Payer: Self-pay

## 2019-12-03 ENCOUNTER — Ambulatory Visit
Admission: RE | Admit: 2019-12-03 | Discharge: 2019-12-03 | Disposition: A | Discharge status: Home / Self Care | Payer: Medicare Other | Source: Ambulatory Visit | Attending: Radiation Oncology | Admitting: Radiation Oncology

## 2019-12-03 DIAGNOSIS — Z510 Encounter for antineoplastic radiation therapy: Secondary | ICD-10-CM | POA: Diagnosis not present

## 2019-12-04 ENCOUNTER — Other Ambulatory Visit: Payer: Self-pay

## 2019-12-04 ENCOUNTER — Ambulatory Visit
Admission: RE | Admit: 2019-12-04 | Discharge: 2019-12-04 | Disposition: A | Discharge status: Home / Self Care | Payer: Medicare Other | Source: Ambulatory Visit | Attending: Radiation Oncology | Admitting: Radiation Oncology

## 2019-12-04 DIAGNOSIS — Z510 Encounter for antineoplastic radiation therapy: Secondary | ICD-10-CM | POA: Diagnosis not present

## 2019-12-05 ENCOUNTER — Ambulatory Visit
Admission: RE | Admit: 2019-12-05 | Discharge: 2019-12-05 | Disposition: A | Discharge status: Home / Self Care | Payer: Medicare Other | Source: Ambulatory Visit | Attending: Radiation Oncology | Admitting: Radiation Oncology

## 2019-12-05 ENCOUNTER — Other Ambulatory Visit: Payer: Self-pay

## 2019-12-05 DIAGNOSIS — Z510 Encounter for antineoplastic radiation therapy: Secondary | ICD-10-CM | POA: Diagnosis not present

## 2019-12-06 ENCOUNTER — Other Ambulatory Visit: Payer: Self-pay

## 2019-12-06 ENCOUNTER — Ambulatory Visit
Admission: RE | Admit: 2019-12-06 | Discharge: 2019-12-06 | Disposition: A | Discharge status: Home / Self Care | Payer: Medicare Other | Source: Ambulatory Visit | Attending: Radiation Oncology | Admitting: Radiation Oncology

## 2019-12-06 DIAGNOSIS — Z510 Encounter for antineoplastic radiation therapy: Secondary | ICD-10-CM | POA: Diagnosis not present

## 2019-12-09 ENCOUNTER — Ambulatory Visit
Admission: RE | Admit: 2019-12-09 | Discharge: 2019-12-09 | Disposition: A | Discharge status: Home / Self Care | Payer: Medicare Other | Source: Ambulatory Visit | Attending: Radiation Oncology | Admitting: Radiation Oncology

## 2019-12-09 ENCOUNTER — Other Ambulatory Visit: Payer: Self-pay

## 2019-12-09 DIAGNOSIS — Z510 Encounter for antineoplastic radiation therapy: Secondary | ICD-10-CM | POA: Diagnosis not present

## 2019-12-10 ENCOUNTER — Ambulatory Visit
Admission: RE | Admit: 2019-12-10 | Discharge: 2019-12-10 | Disposition: A | Discharge status: Home / Self Care | Payer: Medicare Other | Source: Ambulatory Visit | Attending: Radiation Oncology | Admitting: Radiation Oncology

## 2019-12-10 DIAGNOSIS — Z510 Encounter for antineoplastic radiation therapy: Secondary | ICD-10-CM | POA: Diagnosis not present

## 2019-12-11 ENCOUNTER — Inpatient Hospital Stay: Payer: Medicare Other | Attending: Oncology | Admitting: Genetic Counselor

## 2019-12-11 ENCOUNTER — Inpatient Hospital Stay: Payer: Medicare Other

## 2019-12-11 ENCOUNTER — Encounter: Payer: Self-pay | Admitting: Genetic Counselor

## 2019-12-11 ENCOUNTER — Other Ambulatory Visit: Payer: Self-pay

## 2019-12-11 ENCOUNTER — Ambulatory Visit
Admission: RE | Admit: 2019-12-11 | Discharge: 2019-12-11 | Disposition: A | Discharge status: Home / Self Care | Payer: Medicare Other | Source: Ambulatory Visit | Attending: Radiation Oncology | Admitting: Radiation Oncology

## 2019-12-11 DIAGNOSIS — Z8042 Family history of malignant neoplasm of prostate: Secondary | ICD-10-CM | POA: Insufficient documentation

## 2019-12-11 DIAGNOSIS — Z803 Family history of malignant neoplasm of breast: Secondary | ICD-10-CM | POA: Insufficient documentation

## 2019-12-11 DIAGNOSIS — Z8546 Personal history of malignant neoplasm of prostate: Secondary | ICD-10-CM | POA: Diagnosis present

## 2019-12-11 DIAGNOSIS — Z510 Encounter for antineoplastic radiation therapy: Secondary | ICD-10-CM | POA: Diagnosis not present

## 2019-12-11 NOTE — Progress Notes (Signed)
REFERRING PROVIDER: Wyatt Portela, MD [place],  [place] [address]  PRIMARY PROVIDER:  Eber Hong, MD  PRIMARY REASON FOR VISIT:  1. Family history of prostate cancer   2. Family history of breast cancer      HISTORY OF PRESENT ILLNESS:   Brendan Anderson, a 64 y.o. male, was seen for a [place] cancer genetics consultation at the request of Dr. Alen Blew due to a personal and family history of cancer.  Brendan Anderson presents to clinic today to discuss the possibility of a hereditary predisposition to cancer, genetic testing, and to further clarify his future cancer risks, as well as potential cancer risks for family members.   In April 2021, at the age of 68, Brendan Anderson was diagnosed with prostate cancer. Gleason score = 9. The treatment plan prostatectomy and chemotherapy.    CANCER HISTORY:  Oncology History   No history exists.     RISK FACTORS:   Colonoscopy: yes; 10 adenomas - polyposis. Any excessive radiation exposure in the past: no  Past Medical History:  Diagnosis Date  . Anxiety   . Arthritis    hip knees wrist  . Depression   . Family history of breast cancer   . Family history of prostate cancer   . GERD (gastroesophageal reflux disease)   . Prostate cancer ([place])   . Skin cancer     Past Surgical History:  Procedure Laterality Date  . BICEPS TENDON REPAIR Left 07/2016  . EYE SURGERY Bilateral 2005  . LYMPHADENECTOMY Bilateral 06/27/2019   Procedure: LYMPHADENECTOMY, PELVIC;  Surgeon: Raynelle Bring, MD;  [facility];  Service: Urology;  Laterality: Bilateral;  . MOLE REMOVAL  2008   pre CA from face  . NASAL SINUS SURGERY  2010  . ROBOT ASSISTED LAPAROSCOPIC RADICAL PROSTATECTOMY N/A 06/27/2019   Procedure: XI ROBOTIC ASSISTED LAPAROSCOPIC RADICAL PROSTATECTOMY LEVEL 2;  Surgeon: Raynelle Bring, MD;  [facility];  Service: Urology;  Laterality: N/A;  . SHOULDER ARTHROSCOPY WITH ROTATOR CUFF REPAIR Left 11/2016     Social History   Socioeconomic History  . Marital status: Married    Spouse name: Horris Latino  . Number of children: 2  . Years of education: Not on file  . Highest education level: Not on file  Occupational History  . Occupation: retired    Comment: Civil engineer, contracting  Tobacco Use  . Smoking status: Former Smoker    Packs/day: 0.25    Years: 10.00    Pack years: 2.50    Types: Cigars    Quit date: 06/19/1983    Years since quitting: 36.5  . Smokeless tobacco: Never Used  Vaping Use  . Vaping Use: Never used  Substance and Sexual Activity  . Alcohol use: Yes    Comment: rare  . Drug use: Never  . Sexual activity: Not Currently  Other Topics Concern  . Not on file  Social History Narrative  . Not on file   Social Determinants of Health   Financial Resource Strain:   . Difficulty of Paying Living Expenses: Not on file  Food Insecurity:   . Worried About Charity fundraiser in the Last Year: Not on file  . Ran Out of Food in the Last Year: Not on file  Transportation Needs:   . Lack of Transportation (Medical): Not on file  . Lack of Transportation (Non-Medical): Not on file  Physical Activity:   . Days of Exercise per Week: Not on file  .  Minutes of Exercise per Session: Not on file  Stress:   . Feeling of Stress : Not on file  Social Connections:   . Frequency of Communication with Friends and Family: Not on file  . Frequency of Social Gatherings with Friends and Family: Not on file  . Attends Religious Services: Not on file  . Active Member of Clubs or Organizations: Not on file  . Attends Archivist Meetings: Not on file  . Marital Status: Not on file     FAMILY HISTORY:  We obtained a detailed, 4-generation family history.  Significant diagnoses are listed below: Family History  Problem Relation Age of Onset  . Leukemia Mother   . Breast cancer Mother 73  . Cancer Father        NOS  . Breast cancer Paternal Aunt        dx in her  21s  . Brain cancer Paternal Uncle        d. 86s  . Heart attack Maternal Grandmother   . Stroke Maternal Grandfather   . Asthma Paternal Grandmother   . Prostate cancer Paternal Grandfather   . Cancer Nephew 10       brother's grandson - NOS  . Colon cancer Neg Hx   . Pancreatic cancer Neg Hx     The patient has one daughter who is cancer free.  He has one full brother and two maternal half brothers who are cancer free.  His full brother's grandson who was diagnosed with a non-specific cancer.  Both parents are deceased.  The patient's mother had breast cancer at 53 and died at 46.  She had three sisters and a brother who were cancer free.  Her parents died of non-cancer related issues.  The patient's father died of a non-specific cancer.  He had three brothers and a sister.  His sister had breast cancer and died at 75.  One brother had brain cancer and his son had throat cancer.  The other two brothers died of heart attacks, but one had a daughter who died of breast cancer in her 45's.  The paternal grandparents are deceased.  The grandfather was thought to have prostate cancer.  Brendan Anderson is unaware of previous family history of genetic testing for hereditary cancer risks. Patient's maternal ancestors are of Caucasian descent, and paternal ancestors are of Caucasian descent. There is no reported Ashkenazi Jewish ancestry. There is no known consanguinity.  GENETIC COUNSELING ASSESSMENT: Brendan Anderson is a 64 y.o. male with a personal and family history of cancer which is somewhat suggestive of a hereditary cancer syndrome and predisposition to cancer given the combination of prostate and breast cancer in the family, as well as the young age of onset of breast cancer in the paternal cousin. We, therefore, discussed and recommended the following at today's visit.   DISCUSSION: We discussed that 15 - 17% of prostate cancer is hereditary, with most cases associated with BRCA mutations.  There  are other genes that can be associated with hereditary prostate cancer syndromes.  These include ATM, CHEK2 and PALB2.  We discussed that testing is beneficial for several reasons including knowing how to follow individuals after completing their treatment, identifying whether potential treatment options such as PARP inhibitors would be beneficial, and understand if other family members could be at risk for cancer and allow them to undergo genetic testing.   We reviewed the characteristics, features and inheritance patterns of hereditary cancer syndromes. We also discussed genetic testing,  including the appropriate family members to test, the process of testing, insurance coverage and turn-around-time for results. We discussed the implications of a negative, positive, carrier and/or variant of uncertain significant result. We recommended Brendan Anderson pursue genetic testing for the multi cancer gene panel. The Multi-Gene Panel offered by [facility] includes sequencing and/or deletion duplication testing of the following 85 genes: AIP, ALK, APC, ATM, AXIN2,BAP1,  BARD1, BLM, BMPR1A, BRCA1, BRCA2, BRIP1, CASR, CDC73, CDH1, CDK4, CDKN1B, CDKN1C, CDKN2A (p14ARF), CDKN2A (p16INK4a), CEBPA, CHEK2, CTNNA1, DICER1, DIS3L2, EGFR (c.2369C>T, p.Thr790Met variant only), EPCAM (Deletion/duplication testing only), FH, FLCN, GATA2, GPC3, GREM1 (Promoter region deletion/duplication testing only), HOXB13 (c.251G>A, p.Gly84Glu), HRAS, KIT, MAX, MEN1, MET, MITF (c.952G>A, p.Glu318Lys variant only), MLH1, MSH2, MSH3, MSH6, MUTYH, NBN, NF1, NF2, NTHL1, PALB2, PDGFRA, PHOX2B, PMS2, POLD1, POLE, POT1, PRKAR1A, PTCH1, PTEN, RAD50, RAD51C, RAD51D, RB1, RECQL4, RET, RNF43, RUNX1, SDHAF2, SDHA (sequence changes only), SDHB, SDHC, SDHD, SMAD4, SMARCA4, SMARCB1, SMARCE1, STK11, SUFU, TERC, TERT, TMEM127, TP53, TSC1, TSC2, VHL, WRN and WT1.    Based on Brendan Anderson's personal and family history of cancer, he meets medical criteria for genetic  testing. He will go through the DETECT program at [facility] in order to get his genetic testing performed for free.  PLAN: After considering the risks, benefits, and limitations, Brendan Anderson provided informed consent to pursue genetic testing and the blood sample was sent to [facility] for analysis of the multi cancer gene panel. Results should be available within approximately 2-3 weeks' time, at which point they will be disclosed by telephone to Brendan Anderson, as will any additional recommendations warranted by these results. Brendan Anderson will receive a summary of his genetic counseling visit and a copy of his results once available. This information will also be available in Epic.   Lastly, we encouraged Brendan Anderson to remain in contact with cancer genetics annually so that we can continuously update the family history and inform him of any changes in cancer genetics and testing that may be of benefit for this family.   Brendan Anderson questions were answered to his satisfaction today. Our contact information was provided should additional questions or concerns arise. Thank you for the referral and allowing [place] to share in the care of your patient.   Dermot Gremillion P. Florene [place], [place], [facility] Licensed, Insurance risk surveyor [email or web address] phone: [phone]  The patient was seen for a total of 45 minutes in face-to-face genetic counseling.  This patient was discussed with Drs. Magrinat, Lindi Adie and/or Burr Medico who agrees with the above.    _______________________________________________________________________ For Office Staff:  Number of people involved in session: 2 Was an Intern/ student involved with case: no

## 2019-12-12 ENCOUNTER — Ambulatory Visit
Admission: RE | Admit: 2019-12-12 | Discharge: 2019-12-12 | Disposition: A | Discharge status: Home / Self Care | Payer: Medicare Other | Source: Ambulatory Visit | Attending: Radiation Oncology | Admitting: Radiation Oncology

## 2019-12-12 ENCOUNTER — Other Ambulatory Visit: Payer: Self-pay

## 2019-12-12 DIAGNOSIS — Z510 Encounter for antineoplastic radiation therapy: Secondary | ICD-10-CM | POA: Diagnosis not present

## 2019-12-13 ENCOUNTER — Ambulatory Visit
Admission: RE | Admit: 2019-12-13 | Discharge: 2019-12-13 | Disposition: A | Discharge status: Home / Self Care | Payer: Medicare Other | Source: Ambulatory Visit | Attending: Radiation Oncology | Admitting: Radiation Oncology

## 2019-12-13 DIAGNOSIS — Z510 Encounter for antineoplastic radiation therapy: Secondary | ICD-10-CM | POA: Diagnosis present

## 2019-12-13 DIAGNOSIS — C61 Malignant neoplasm of prostate: Secondary | ICD-10-CM | POA: Insufficient documentation

## 2019-12-16 ENCOUNTER — Ambulatory Visit
Admission: RE | Admit: 2019-12-16 | Discharge: 2019-12-16 | Disposition: A | Discharge status: Home / Self Care | Payer: Medicare Other | Source: Ambulatory Visit | Attending: Radiation Oncology | Admitting: Radiation Oncology

## 2019-12-16 ENCOUNTER — Other Ambulatory Visit: Payer: Self-pay

## 2019-12-16 DIAGNOSIS — Z510 Encounter for antineoplastic radiation therapy: Secondary | ICD-10-CM | POA: Diagnosis not present

## 2019-12-17 ENCOUNTER — Ambulatory Visit
Admission: RE | Admit: 2019-12-17 | Discharge: 2019-12-17 | Disposition: A | Discharge status: Home / Self Care | Payer: Medicare Other | Source: Ambulatory Visit | Attending: Radiation Oncology | Admitting: Radiation Oncology

## 2019-12-17 ENCOUNTER — Other Ambulatory Visit: Payer: Self-pay

## 2019-12-17 DIAGNOSIS — Z510 Encounter for antineoplastic radiation therapy: Secondary | ICD-10-CM | POA: Diagnosis not present

## 2019-12-18 ENCOUNTER — Ambulatory Visit
Admission: RE | Admit: 2019-12-18 | Discharge: 2019-12-18 | Disposition: A | Discharge status: Home / Self Care | Payer: Medicare Other | Source: Ambulatory Visit | Attending: Radiation Oncology | Admitting: Radiation Oncology

## 2019-12-18 DIAGNOSIS — Z510 Encounter for antineoplastic radiation therapy: Secondary | ICD-10-CM | POA: Diagnosis not present

## 2019-12-19 ENCOUNTER — Ambulatory Visit
Admission: RE | Admit: 2019-12-19 | Discharge: 2019-12-19 | Disposition: A | Discharge status: Home / Self Care | Payer: Medicare Other | Source: Ambulatory Visit | Attending: Radiation Oncology | Admitting: Radiation Oncology

## 2019-12-19 DIAGNOSIS — Z510 Encounter for antineoplastic radiation therapy: Secondary | ICD-10-CM | POA: Diagnosis not present

## 2019-12-20 ENCOUNTER — Ambulatory Visit
Admission: RE | Admit: 2019-12-20 | Discharge: 2019-12-20 | Disposition: A | Discharge status: Home / Self Care | Payer: Medicare Other | Source: Ambulatory Visit | Attending: Radiation Oncology | Admitting: Radiation Oncology

## 2019-12-20 DIAGNOSIS — Z510 Encounter for antineoplastic radiation therapy: Secondary | ICD-10-CM | POA: Diagnosis not present

## 2019-12-23 ENCOUNTER — Telehealth: Payer: Self-pay | Admitting: Genetic Counselor

## 2019-12-23 ENCOUNTER — Encounter: Payer: Self-pay | Admitting: Genetic Counselor

## 2019-12-23 ENCOUNTER — Ambulatory Visit
Admission: RE | Admit: 2019-12-23 | Discharge: 2019-12-23 | Disposition: A | Discharge status: Home / Self Care | Payer: Medicare Other | Source: Ambulatory Visit | Attending: Radiation Oncology | Admitting: Radiation Oncology

## 2019-12-23 DIAGNOSIS — Z510 Encounter for antineoplastic radiation therapy: Secondary | ICD-10-CM | POA: Diagnosis not present

## 2019-12-23 DIAGNOSIS — Z1379 Encounter for other screening for genetic and chromosomal anomalies: Secondary | ICD-10-CM | POA: Insufficient documentation

## 2019-12-23 NOTE — Telephone Encounter (Signed)
LM on VM that results are back and to please call.  Left CB instructions. 

## 2019-12-24 ENCOUNTER — Ambulatory Visit: Payer: Self-pay | Admitting: Genetic Counselor

## 2019-12-24 ENCOUNTER — Ambulatory Visit
Admission: RE | Admit: 2019-12-24 | Discharge: 2019-12-24 | Disposition: A | Discharge status: Home / Self Care | Payer: Medicare Other | Source: Ambulatory Visit | Attending: Radiation Oncology | Admitting: Radiation Oncology

## 2019-12-24 DIAGNOSIS — Z510 Encounter for antineoplastic radiation therapy: Secondary | ICD-10-CM | POA: Diagnosis not present

## 2019-12-24 DIAGNOSIS — Z1379 Encounter for other screening for genetic and chromosomal anomalies: Secondary | ICD-10-CM

## 2019-12-24 NOTE — Progress Notes (Signed)
HPI:  Brendan Anderson was previously seen in the [facility] due to a personal and family history of cancer and concerns regarding a hereditary predisposition to cancer. Please refer to our prior cancer genetics clinic note for more information regarding our discussion, assessment and recommendations, at the time. Brendan Anderson recent genetic test results were disclosed to him, as were recommendations warranted by these results. These results and recommendations are discussed in more detail below.  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  The Multi-Gene Panel offered by [facility] includes sequencing and/or deletion duplication testing of the following 85 genes: AIP, ALK, APC, ATM, AXIN2,BAP1,  BARD1, BLM, BMPR1A, BRCA1, BRCA2, BRIP1, CASR, CDC73, CDH1, CDK4, CDKN1B, CDKN1C, CDKN2A (p14ARF), CDKN2A (p16INK4a), CEBPA, CHEK2, CTNNA1, DICER1, DIS3L2, EGFR (c.2369C>T, p.Thr790Met variant only), EPCAM (Deletion/duplication testing only), FH, FLCN, GATA2, GPC3, GREM1 (Promoter region deletion/duplication testing only), HOXB13 (c.251G>A, p.Gly84Glu), HRAS, KIT, MAX, MEN1, MET, MITF (c.952G>A, p.Glu318Lys variant only), MLH1, MSH2, MSH3, MSH6, MUTYH, NBN, NF1, NF2, NTHL1, PALB2, PDGFRA, PHOX2B, PMS2, POLD1, POLE, POT1, PRKAR1A, PTCH1, PTEN, RAD50, RAD51C, RAD51D, RB1, RECQL4, RET, RNF43, RUNX1, SDHAF2, SDHA (sequence changes only), SDHB, SDHC, SDHD, SMAD4, SMARCA4, SMARCB1, SMARCE1, STK11, SUFU, TERC, TERT, TMEM127, TP53, TSC1, TSC2, VHL, WRN and WT1.  The report date is December 19, 2019.     FAMILY HISTORY:  We obtained a detailed, 4-generation family history.  Significant diagnoses are listed below: Family History  Problem Relation Age of Onset  . Leukemia Mother   .  Breast cancer Mother 68  . Cancer Father        NOS  . Breast cancer Paternal Aunt        dx in her 30s  . Brain cancer Paternal Uncle        d. 98s  . Heart attack Maternal Grandmother   . Stroke Maternal Grandfather   . Asthma Paternal Grandmother   . Prostate cancer Paternal Grandfather   . Cancer Nephew 10       brother's grandson - NOS  . Colon cancer Neg Hx   . Pancreatic cancer Neg Hx     The patient has one daughter who is cancer free.  He has one full brother and two maternal half brothers who are cancer free.  His full brother's grandson who was diagnosed with a non-specific cancer.  Both parents are deceased.  The patient's mother had breast cancer at 23 and died at 110.  She had three sisters and a brother who were cancer free.  Her parents died of non-cancer related issues.  The patient's father died of a non-specific cancer.  He had three brothers and a sister.  His sister had breast cancer and died at 63.  One brother had brain cancer and his son had throat cancer.  The other two brothers died of heart attacks, but one had a daughter who died of breast cancer in her 65's.  The paternal grandparents are deceased.  The grandfather was thought to have prostate cancer.  Brendan Anderson is unaware of previous family history of genetic testing for hereditary cancer risks. Patient's maternal ancestors are of Caucasian descent, and paternal ancestors are of Caucasian descent. There is no reported Ashkenazi Jewish ancestry. There is no known consanguinity.    GENETIC TEST RESULTS: Genetic testing  reported out on December 19, 2019 through the multi-cancer panel found no pathogenic mutations. The Multi-Gene Panel offered by [facility] includes sequencing and/or deletion duplication testing of the following 85 genes: AIP, ALK, APC, ATM, AXIN2,BAP1,  BARD1, BLM, BMPR1A, BRCA1, BRCA2, BRIP1, CASR, CDC73, CDH1, CDK4, CDKN1B, CDKN1C, CDKN2A (p14ARF), CDKN2A (p16INK4a), CEBPA, CHEK2, CTNNA1,  DICER1, DIS3L2, EGFR (c.2369C>T, p.Thr790Met variant only), EPCAM (Deletion/duplication testing only), FH, FLCN, GATA2, GPC3, GREM1 (Promoter region deletion/duplication testing only), HOXB13 (c.251G>A, p.Gly84Glu), HRAS, KIT, MAX, MEN1, MET, MITF (c.952G>A, p.Glu318Lys variant only), MLH1, MSH2, MSH3, MSH6, MUTYH, NBN, NF1, NF2, NTHL1, PALB2, PDGFRA, PHOX2B, PMS2, POLD1, POLE, POT1, PRKAR1A, PTCH1, PTEN, RAD50, RAD51C, RAD51D, RB1, RECQL4, RET, RNF43, RUNX1, SDHAF2, SDHA (sequence changes only), SDHB, SDHC, SDHD, SMAD4, SMARCA4, SMARCB1, SMARCE1, STK11, SUFU, TERC, TERT, TMEM127, TP53, TSC1, TSC2, VHL, WRN and WT1. The test report has been scanned into EPIC and is located under the Molecular Pathology section of the Results Review tab.  A portion of the result report is included below for reference.     We discussed with Brendan Anderson that because current genetic testing is not perfect, it is possible there may be a gene mutation in one of these genes that current testing cannot detect, but that chance is small.  We also discussed, that there could be another gene that has not yet been discovered, or that we have not yet tested, that is responsible for the cancer diagnoses in the family. It is also possible there is a hereditary cause for the cancer in the family that Brendan Anderson did not inherit and therefore was not identified in his testing.  Therefore, it is important to remain in touch with cancer genetics in the future so that we can continue to offer Brendan Anderson the most up to date genetic testing.   ADDITIONAL GENETIC TESTING: We discussed with Brendan Anderson that his genetic testing was fairly extensive.  If there are genes identified to increase cancer risk that can be analyzed in the future, we would be happy to discuss and coordinate this testing at that time.    CANCER SCREENING RECOMMENDATIONS: Brendan Anderson test result is considered negative (normal).  This means that we have not identified a  hereditary cause for his personal and family history of cancer at this time. Most cancers happen by chance and this negative test suggests that his cancer may fall into this category.    While reassuring, this does not definitively rule out a hereditary predisposition to cancer. It is still possible that there could be genetic mutations that are undetectable by current technology. There could be genetic mutations in genes that have not been tested or identified to increase cancer risk.  Therefore, it is recommended he continue to follow the cancer management and screening guidelines provided by his oncology and primary healthcare provider.   An individual's cancer risk and medical management are not determined by genetic test results alone. Overall cancer risk assessment incorporates additional factors, including personal medical history, family history, and any available genetic information that may result in a personalized plan for cancer prevention and surveillance  RECOMMENDATIONS FOR FAMILY MEMBERS:  Individuals in this family might be at some increased risk of developing cancer, over the general population risk, simply due to the family history of cancer.  We recommended women in this family have a yearly mammogram beginning at age 84, or 74 years younger than the earliest onset of cancer, an annual clinical breast exam, and perform monthly breast self-exams. Women in  this family should also have a gynecological exam as recommended by their primary provider. All family members should be referred for colonoscopy starting at age 57.  FOLLOW-UP: Lastly, we discussed with Brendan Anderson that cancer genetics is a rapidly advancing field and it is possible that new genetic tests will be appropriate for him and/or his family members in the future. We encouraged him to remain in contact with cancer genetics on an annual basis so we can update his personal and family histories and let him know of advances in  cancer genetics that may benefit this family.   Our contact number was provided. Brendan Anderson questions were answered to his satisfaction, and he knows he is welcome to call us at anytime with additional questions or concerns.   Roma Kayser, [place], [facility] Licensed, Certified Genetic Counselor [email or web address]

## 2019-12-25 ENCOUNTER — Ambulatory Visit
Admission: RE | Admit: 2019-12-25 | Discharge: 2019-12-25 | Disposition: A | Discharge status: Home / Self Care | Payer: Medicare Other | Source: Ambulatory Visit | Attending: Radiation Oncology | Admitting: Radiation Oncology

## 2019-12-25 DIAGNOSIS — Z510 Encounter for antineoplastic radiation therapy: Secondary | ICD-10-CM | POA: Diagnosis not present

## 2019-12-26 ENCOUNTER — Ambulatory Visit
Admission: RE | Admit: 2019-12-26 | Discharge: 2019-12-26 | Disposition: A | Discharge status: Home / Self Care | Payer: Medicare Other | Source: Ambulatory Visit | Attending: Radiation Oncology | Admitting: Radiation Oncology

## 2019-12-26 DIAGNOSIS — Z510 Encounter for antineoplastic radiation therapy: Secondary | ICD-10-CM | POA: Diagnosis not present

## 2019-12-27 ENCOUNTER — Ambulatory Visit
Admission: RE | Admit: 2019-12-27 | Discharge: 2019-12-27 | Disposition: A | Discharge status: Home / Self Care | Payer: Medicare Other | Source: Ambulatory Visit | Attending: Radiation Oncology | Admitting: Radiation Oncology

## 2019-12-27 DIAGNOSIS — Z510 Encounter for antineoplastic radiation therapy: Secondary | ICD-10-CM | POA: Diagnosis not present

## 2019-12-30 ENCOUNTER — Ambulatory Visit
Admission: RE | Admit: 2019-12-30 | Discharge: 2019-12-30 | Disposition: A | Discharge status: Home / Self Care | Payer: Medicare Other | Source: Ambulatory Visit | Attending: Radiation Oncology | Admitting: Radiation Oncology

## 2019-12-30 DIAGNOSIS — Z510 Encounter for antineoplastic radiation therapy: Secondary | ICD-10-CM | POA: Diagnosis not present

## 2019-12-31 ENCOUNTER — Ambulatory Visit
Admission: RE | Admit: 2019-12-31 | Discharge: 2019-12-31 | Disposition: A | Discharge status: Home / Self Care | Payer: Medicare Other | Source: Ambulatory Visit | Attending: Radiation Oncology | Admitting: Radiation Oncology

## 2019-12-31 DIAGNOSIS — Z510 Encounter for antineoplastic radiation therapy: Secondary | ICD-10-CM | POA: Diagnosis not present

## 2020-01-01 ENCOUNTER — Ambulatory Visit
Admission: RE | Admit: 2020-01-01 | Discharge: 2020-01-01 | Disposition: A | Discharge status: Home / Self Care | Payer: Medicare Other | Source: Ambulatory Visit | Attending: Radiation Oncology | Admitting: Radiation Oncology

## 2020-01-01 ENCOUNTER — Other Ambulatory Visit: Payer: Self-pay

## 2020-01-01 DIAGNOSIS — Z510 Encounter for antineoplastic radiation therapy: Secondary | ICD-10-CM | POA: Diagnosis not present

## 2020-01-02 ENCOUNTER — Ambulatory Visit
Admission: RE | Admit: 2020-01-02 | Discharge: 2020-01-02 | Disposition: A | Discharge status: Home / Self Care | Payer: Medicare Other | Source: Ambulatory Visit | Attending: Radiation Oncology | Admitting: Radiation Oncology

## 2020-01-02 DIAGNOSIS — Z510 Encounter for antineoplastic radiation therapy: Secondary | ICD-10-CM | POA: Diagnosis not present

## 2020-01-03 ENCOUNTER — Ambulatory Visit
Admission: RE | Admit: 2020-01-03 | Discharge: 2020-01-03 | Disposition: A | Discharge status: Home / Self Care | Payer: Medicare Other | Source: Ambulatory Visit | Attending: Radiation Oncology | Admitting: Radiation Oncology

## 2020-01-03 DIAGNOSIS — Z510 Encounter for antineoplastic radiation therapy: Secondary | ICD-10-CM | POA: Diagnosis not present

## 2020-01-06 ENCOUNTER — Other Ambulatory Visit: Payer: Self-pay

## 2020-01-06 ENCOUNTER — Ambulatory Visit
Admission: RE | Admit: 2020-01-06 | Discharge: 2020-01-06 | Disposition: A | Discharge status: Home / Self Care | Payer: Medicare Other | Source: Ambulatory Visit | Attending: Radiation Oncology | Admitting: Radiation Oncology

## 2020-01-06 DIAGNOSIS — Z510 Encounter for antineoplastic radiation therapy: Secondary | ICD-10-CM | POA: Diagnosis not present

## 2020-01-07 ENCOUNTER — Other Ambulatory Visit: Payer: Self-pay

## 2020-01-07 ENCOUNTER — Ambulatory Visit
Admission: RE | Admit: 2020-01-07 | Discharge: 2020-01-07 | Disposition: A | Discharge status: Home / Self Care | Payer: Medicare Other | Source: Ambulatory Visit | Attending: Radiation Oncology | Admitting: Radiation Oncology

## 2020-01-07 DIAGNOSIS — Z510 Encounter for antineoplastic radiation therapy: Secondary | ICD-10-CM | POA: Diagnosis not present

## 2020-01-08 ENCOUNTER — Other Ambulatory Visit: Payer: Self-pay

## 2020-01-08 ENCOUNTER — Ambulatory Visit
Admission: RE | Admit: 2020-01-08 | Discharge: 2020-01-08 | Disposition: A | Discharge status: Home / Self Care | Payer: Medicare Other | Source: Ambulatory Visit | Attending: Radiation Oncology | Admitting: Radiation Oncology

## 2020-01-08 DIAGNOSIS — Z510 Encounter for antineoplastic radiation therapy: Secondary | ICD-10-CM | POA: Diagnosis not present

## 2020-01-09 ENCOUNTER — Ambulatory Visit
Admission: RE | Admit: 2020-01-09 | Discharge: 2020-01-09 | Disposition: A | Discharge status: Home / Self Care | Payer: Medicare Other | Source: Ambulatory Visit | Attending: Radiation Oncology | Admitting: Radiation Oncology

## 2020-01-09 ENCOUNTER — Other Ambulatory Visit: Payer: Self-pay

## 2020-01-09 DIAGNOSIS — Z510 Encounter for antineoplastic radiation therapy: Secondary | ICD-10-CM | POA: Diagnosis not present

## 2020-01-10 ENCOUNTER — Ambulatory Visit
Admission: RE | Admit: 2020-01-10 | Discharge: 2020-01-10 | Disposition: A | Discharge status: Home / Self Care | Payer: Medicare Other | Source: Ambulatory Visit | Attending: Radiation Oncology | Admitting: Radiation Oncology

## 2020-01-10 DIAGNOSIS — Z510 Encounter for antineoplastic radiation therapy: Secondary | ICD-10-CM | POA: Diagnosis not present

## 2020-01-13 ENCOUNTER — Other Ambulatory Visit: Payer: Self-pay

## 2020-01-13 ENCOUNTER — Ambulatory Visit
Admission: RE | Admit: 2020-01-13 | Discharge: 2020-01-13 | Disposition: A | Discharge status: Home / Self Care | Payer: Medicare Other | Source: Ambulatory Visit | Attending: Radiation Oncology | Admitting: Radiation Oncology

## 2020-01-13 DIAGNOSIS — Z510 Encounter for antineoplastic radiation therapy: Secondary | ICD-10-CM | POA: Insufficient documentation

## 2020-01-13 DIAGNOSIS — C61 Malignant neoplasm of prostate: Secondary | ICD-10-CM | POA: Insufficient documentation

## 2020-01-14 ENCOUNTER — Ambulatory Visit
Admission: RE | Admit: 2020-01-14 | Discharge: 2020-01-14 | Disposition: A | Discharge status: Home / Self Care | Payer: Medicare Other | Source: Ambulatory Visit | Attending: Radiation Oncology | Admitting: Radiation Oncology

## 2020-01-14 DIAGNOSIS — Z510 Encounter for antineoplastic radiation therapy: Secondary | ICD-10-CM | POA: Diagnosis not present

## 2020-01-15 ENCOUNTER — Other Ambulatory Visit: Payer: Self-pay

## 2020-01-15 ENCOUNTER — Ambulatory Visit
Admission: RE | Admit: 2020-01-15 | Discharge: 2020-01-15 | Disposition: A | Discharge status: Home / Self Care | Payer: Medicare Other | Source: Ambulatory Visit | Attending: Radiation Oncology | Admitting: Radiation Oncology

## 2020-01-15 DIAGNOSIS — Z510 Encounter for antineoplastic radiation therapy: Secondary | ICD-10-CM | POA: Diagnosis not present

## 2020-01-16 ENCOUNTER — Ambulatory Visit
Admission: RE | Admit: 2020-01-16 | Discharge: 2020-01-16 | Disposition: A | Discharge status: Home / Self Care | Payer: Medicare Other | Source: Ambulatory Visit | Attending: Radiation Oncology | Admitting: Radiation Oncology

## 2020-01-16 DIAGNOSIS — Z510 Encounter for antineoplastic radiation therapy: Secondary | ICD-10-CM | POA: Diagnosis not present

## 2020-01-17 ENCOUNTER — Ambulatory Visit: Payer: Medicare Other

## 2020-01-20 ENCOUNTER — Telehealth: Payer: Self-pay | Admitting: Radiation Oncology

## 2020-01-20 ENCOUNTER — Ambulatory Visit
Admission: RE | Admit: 2020-01-20 | Discharge: 2020-01-20 | Disposition: A | Discharge status: Home / Self Care | Payer: Medicare Other | Source: Ambulatory Visit | Attending: Radiation Oncology | Admitting: Radiation Oncology

## 2020-01-20 DIAGNOSIS — Z510 Encounter for antineoplastic radiation therapy: Secondary | ICD-10-CM | POA: Diagnosis not present

## 2020-01-20 NOTE — Telephone Encounter (Signed)
Received voicemail message from patient requesting a return call. Phoned patient back to inquire. Patient explains he and his wife live an hour away and he is afraid to leave her since she woke up sick with nausea and vomiting. Patient wishing to cancel his radiation treatment for today. Explained he should anticipate completing radiation therapy a day later than originally scheduled. Patient verbalized understanding of all reviewed and appreciation for the return call.   Unable to reach therapist on L1. Notified lead therapist, Alm Bustard, RT, that the patient cancelled treatment for today.

## 2020-01-21 ENCOUNTER — Other Ambulatory Visit: Payer: Self-pay

## 2020-01-21 ENCOUNTER — Ambulatory Visit
Admission: RE | Admit: 2020-01-21 | Discharge: 2020-01-21 | Disposition: A | Discharge status: Home / Self Care | Payer: Medicare Other | Source: Ambulatory Visit | Attending: Radiation Oncology | Admitting: Radiation Oncology

## 2020-01-21 DIAGNOSIS — Z510 Encounter for antineoplastic radiation therapy: Secondary | ICD-10-CM | POA: Diagnosis not present

## 2020-01-22 ENCOUNTER — Ambulatory Visit
Admission: RE | Admit: 2020-01-22 | Discharge: 2020-01-22 | Disposition: A | Discharge status: Home / Self Care | Payer: Medicare Other | Source: Ambulatory Visit | Attending: Radiation Oncology | Admitting: Radiation Oncology

## 2020-01-22 ENCOUNTER — Ambulatory Visit: Payer: Medicare Other

## 2020-01-22 DIAGNOSIS — Z510 Encounter for antineoplastic radiation therapy: Secondary | ICD-10-CM | POA: Diagnosis not present

## 2020-01-23 ENCOUNTER — Encounter: Payer: Self-pay | Admitting: Medical Oncology

## 2020-01-23 ENCOUNTER — Ambulatory Visit
Admission: RE | Admit: 2020-01-23 | Discharge: 2020-01-23 | Disposition: A | Discharge status: Home / Self Care | Payer: Medicare Other | Source: Ambulatory Visit | Attending: Radiation Oncology | Admitting: Radiation Oncology

## 2020-01-23 ENCOUNTER — Encounter: Payer: Self-pay | Admitting: Urology

## 2020-01-23 DIAGNOSIS — Z510 Encounter for antineoplastic radiation therapy: Secondary | ICD-10-CM | POA: Diagnosis not present

## 2020-01-23 NOTE — Progress Notes (Signed)
Congratulated patient on completion of radiation. He has tolerated treatment well and no major complaints. He will follow up with [place], [place], 12/15 via telephone.He currently does not have a follow up visit with Dr. Alinda Money. I will send Dr.Borden a note asking him to get pt an appointment. He voiced his appreciation for the wonderful care he has received and I asked him  to call me if I can assist him in the future. He voiced understanding.

## 2020-02-24 ENCOUNTER — Encounter: Payer: Self-pay | Admitting: Urology

## 2020-02-24 ENCOUNTER — Telehealth: Payer: Self-pay

## 2020-02-24 ENCOUNTER — Other Ambulatory Visit: Payer: Self-pay

## 2020-02-24 NOTE — Telephone Encounter (Signed)
Spoke with patient in regards to telephone appointment with Freeman Caldron PA on 02/26/20 @ 9:30am. Patient verbalized understanding of appointment date and time. Reviewed meaningful use, AUA and prostate questions. TM

## 2020-02-24 NOTE — Progress Notes (Signed)
Patient has 1 month follow-up telephone appointment with Freeman Caldron PA on 02/26/20 @ 9:30am. Patient states nocturia 3 times per night. Patient denies dysuria. Patient states having regular bowel movements. Patient states that he is emptying his bladder completely. Patient state urine stream is strong and steady. Patient states having urgency. Patient states having leakage at night. Patient denies having straining or pushing when urinating. Patient states mild fatigue. Patient states that he has a follow-up appointment with [facility] in February.

## 2020-02-26 ENCOUNTER — Other Ambulatory Visit: Payer: Self-pay

## 2020-02-26 ENCOUNTER — Ambulatory Visit
Admission: RE | Admit: 2020-02-26 | Discharge: 2020-02-26 | Disposition: A | Discharge status: Home / Self Care | Payer: Medicare Other | Source: Ambulatory Visit | Attending: Urology | Admitting: Urology

## 2020-02-26 DIAGNOSIS — C775 Secondary and unspecified malignant neoplasm of intrapelvic lymph nodes: Secondary | ICD-10-CM

## 2020-02-26 NOTE — Progress Notes (Signed)
  Radiation Oncology         [phone]) [phone] ________________________________  Name: Brendan Anderson MRN: [id number]  Date: 01/23/2020  DOB: 1955-06-01  End of Treatment Note  Diagnosis:   64 y.o. gentleman with a rising, detectable postoperative PSA of 0.38 s/p RALP in 06/2019 for stage pT3b, pN1, Gleason 5+4 prostate cancer     Indication for treatment:  Curative, Definitive salvage Radiotherapy; concurrent with ST-ADT      Radiation treatment dates:   12/02/19 - 01/23/20  Site/dose:  1. The prostate fossa and pelvic lymph nodes were initially treated to 45 Gy in 25 fractions of 1.8 Gy  2. The prostate fossa only was boosted to 68.4 Gy with 13 additional fractions of 1.8 Gy   Beams/energy:  1. The prostate fossa  and pelvic lymph nodes were initially treated using VMAT intensity modulated radiotherapy delivering 6 megavolt photons. Image guidance was performed with CB-CT studies prior to each fraction. He was immobilized with a body fix lower extremity mold.  2. The prostate fossa only was boosted using VMAT intensity modulated radiotherapy delivering 6 megavolt photons. Image guidance was performed with CB-CT studies prior to each fraction. He was immobilized with a body fix lower extremity mold.  Narrative: The patient tolerated radiation treatment relatively well with only minor urinary irritation and modest fatigue.  He did experience mild fatigue and loose bowel movements but denied any significant LUTS throughout treatment.  He specifically denied dysuria, gross hematuria, excessive daytime frequency, urgency, or incomplete emptying.  Plan: The patient has completed radiation treatment. He will return to radiation oncology clinic for routine followup in one month. I advised him to call or return sooner if he has any questions or concerns related to his recovery or treatment. ________________________________  Sheral Apley. Tammi Klippel, M.D.

## 2020-02-26 NOTE — Progress Notes (Signed)
Radiation Oncology         [phone] ________________________________  Name: Brendan Anderson MRN: [id number]  Date: 02/26/2020  DOB: 10/17/1955  Post Treatment Note  CC: Eber Hong, MD  Raynelle Bring, MD  Diagnosis:   64 y.o. gentleman with a rising, detectable postoperative PSA of 0.38 s/p RALP in 06/2019 for stage pT3b, pN1, Gleason 5+4 prostate cancer  Interval Since Last Radiation:  5 weeks  12/02/19 - 01/23/20: 1. The prostate fossa and pelvic lymph nodes were initially treated to 45 Gy in 25 fractions of 1.8 Gy  2. The prostate fossa only was boosted to 68.4 Gy with 13 additional fractions of 1.8 Gy    Narrative:  I spoke with the patient to conduct his routine scheduled 1 month follow up visit via telephone to spare the patient unnecessary potential exposure in the healthcare setting during the current COVID-19 pandemic.  The patient was notified in advance and gave permission to proceed with this visit format.  He tolerated radiation treatment relatively well with only minor urinary irritation and modest fatigue.  He did experience mild fatigue and loose bowel movements but denied any significant LUTS throughout treatment.  He specifically denied dysuria, gross hematuria, excessive daytime frequency, urgency, or incomplete emptying.                              On review of systems, the patient states that he continues doing very well in general.  He continues with mild fatigue but reports that his bowel movements have returned to normal and he is without any significant bothersome LUTS.  He denies abdominal pain, nausea, vomiting, diarrhea or constipation.  He specifically denies gross hematuria, dysuria, straining to void, incomplete bladder emptying or incontinence. His current IPSS score is 4, indicating mild urinary symptoms with nocturia x3 per night which is his baseline.  He continues to tolerate the ADT fairly well despite some mild fatigue.  Otherwise, he is quite  pleased with his progress to date.  ALLERGIES:  is allergic to codeine.  Meds: Current Outpatient Medications  Medication Sig Dispense Refill  . Al Hyd-Mg Tr-Alg Ac-Sod Bicarb (GAVISCON-2 PO) Take 2 tablets by mouth at bedtime.    . cetirizine (ZYRTEC) 10 MG tablet Take 10 mg by mouth daily.    Marland Kitchen esomeprazole (NEXIUM) 40 MG capsule Take 1 capsule by mouth daily.    . Multiple Vitamin (MULTIVITAMIN) tablet Take 1 tablet by mouth daily.    . predniSONE (DELTASONE) 10 MG tablet Take 10 mg by mouth daily with breakfast. Patient will finish medication on Wednesday 02/26/20    . sildenafil (VIAGRA) 100 MG tablet Take 25 mg by mouth daily as needed for erectile dysfunction.    . traZODone (DESYREL) 100 MG tablet Take 100 mg by mouth at bedtime.     No current facility-administered medications for this encounter.    Physical Findings:  vitals were not taken for this visit.   /Unable to assess due to telephone follow-up visit format.  Lab Findings: Lab Results  Component Value Date   WBC 5.3 06/19/2019   HGB 11.1 (L) 06/28/2019   HCT 33.8 (L) 06/28/2019   MCV 90.5 06/19/2019   PLT 185 06/19/2019     Radiographic Findings: No results found.  Impression/Plan: 1. 64 y.o. gentleman with a rising, detectable postoperative PSA of 0.38 s/p RALP in 06/2019 for stage pT3b, pN1, Gleason 5+4 prostate cancer.  He will continue to  follow up with urology for ongoing PSA determinations and has an appointment scheduled with Dr. Alinda Money in February 2022. He understands what to expect with regards to PSA monitoring going forward. I will look forward to following his response to treatment via correspondence with urology, and would be happy to continue to participate in his care if clinically indicated. I talked to the patient about what to expect in the future, including his risk for erectile dysfunction and rectal bleeding. I encouraged him to call or return to the office if he has any questions regarding  his previous radiation or possible radiation side effects. He was comfortable with this plan and will follow up as needed.    Nicholos Johns, PA-C

## 2020-05-07 ENCOUNTER — Encounter: Payer: Self-pay | Admitting: Medical Oncology

## 2020-05-07 NOTE — Progress Notes (Signed)
Patient's wife left a voicemail on phone regarding a recent MRI and needing a visit. I returned call to patient to discuss. He states he has been having lower back pain and had some x-rays. His primary care, Dr. Megan Salon reviewed results of MRI and results show suspicious activity in the sacrum.He asked if he should see the oncologist here.  We discussed having results faxed to Dr. Alinda Money and following up with him. He just finished radiation in January with an undetectable PSA. He has requested report be faxed to Dr. Alinda Money. I asked him to call me if I can assist him further. I notified Dr. Alinda Money of the above.

## 2020-05-11 ENCOUNTER — Encounter: Payer: Self-pay | Admitting: Medical Oncology

## 2020-05-12 NOTE — Progress Notes (Signed)
[facility] Rad[facility] and asked that MRIs of spine and abdoment be shared to [facility] via [facility].

## 2020-05-12 NOTE — Progress Notes (Signed)
I spoke with patient to inform him, per Dr. Lynne Logan request, I ordered MRI of spine and abdomen films from [facility] to be reviewed at Urology Conference 3/11. After the conference, Dr. Alinda Money will call him to discuss results. He voiced understanding and was very appreciative of the call.

## 2020-05-21 ENCOUNTER — Encounter: Payer: Self-pay | Admitting: Medical Oncology

## 2020-05-21 NOTE — Progress Notes (Signed)
Brendan Anderson, wife called asking what are the next steps to address her husband's pain in the sacral area. She states he had  MRI's done in [place], [place] but no real answers.  I shared with her, I spoke with her husband 2/28, to let him know Dr. Alinda Money, will present him at urology conference in the morning. I explained we will review the images from [facility] and the physicians will discuss what the next steps should be.  I will ask Dr. Alinda Money to call them with an update. She voiced concerns over his pain and appreciation for the update.

## 2021-06-21 ENCOUNTER — Other Ambulatory Visit (HOSPITAL_COMMUNITY): Payer: Self-pay | Admitting: Urology

## 2021-06-21 DIAGNOSIS — C61 Malignant neoplasm of prostate: Secondary | ICD-10-CM

## 2021-07-05 ENCOUNTER — Encounter (HOSPITAL_COMMUNITY)
Admission: RE | Admit: 2021-07-05 | Discharge: 2021-07-05 | Disposition: A | Discharge status: Home / Self Care | Payer: Medicare Other | Source: Ambulatory Visit | Attending: Urology | Admitting: Urology

## 2021-07-05 DIAGNOSIS — C61 Malignant neoplasm of prostate: Secondary | ICD-10-CM | POA: Insufficient documentation

## 2021-07-05 MED ORDER — PIFLIFOLASTAT F 18 (PYLARIFY) INJECTION
9.0000 | Freq: Once | INTRAVENOUS | Status: AC
  Administered 2021-07-05: 9.38 via INTRAVENOUS

## 2021-07-13 ENCOUNTER — Encounter (HOSPITAL_COMMUNITY): Payer: Self-pay

## 2021-07-13 NOTE — Progress Notes (Signed)
Patient's wife called questioning how to get the patient a consultation with Dr. Delton Coombes. Wife reports that she herself is a patient of Dr. Delton Coombes and would like his input on her husband's care. States that patient is currently being seen by Dr. Alinda Money. Encouraged wife to call Dr. Lynne Logan office and request a referral to Dr. Delton Coombes. Patient's wife verbalized understanding. ?

## 2021-07-29 ENCOUNTER — Telehealth: Payer: Self-pay

## 2021-07-29 NOTE — Telephone Encounter (Signed)
Appointment reminder. I spoke w/ patient's spouse Chima Astorino, verified her identity and reminded her of patient's 8:30am-07/30/21 in-person appointment w/ Ashlyn Bruning PA-C. I advised patient to arrive 82mn early for check-in. I left my extension [phone] case patient needs anything. Ms. FLebleuverbalized understanding of information.

## 2021-07-30 ENCOUNTER — Encounter: Payer: Self-pay | Admitting: Urology

## 2021-07-30 ENCOUNTER — Other Ambulatory Visit: Payer: Self-pay

## 2021-07-30 ENCOUNTER — Ambulatory Visit
Admission: RE | Admit: 2021-07-30 | Discharge: 2021-07-30 | Disposition: A | Discharge status: Home / Self Care | Payer: Medicare Other | Source: Ambulatory Visit | Attending: Urology | Admitting: Urology

## 2021-07-30 ENCOUNTER — Ambulatory Visit
Admission: RE | Admit: 2021-07-30 | Discharge: 2021-07-30 | Disposition: A | Discharge status: Home / Self Care | Payer: Medicare Other | Source: Ambulatory Visit | Attending: Radiation Oncology | Admitting: Radiation Oncology

## 2021-07-30 VITALS — BP 133/81 | HR 68 | Temp 96.7°F | Resp 18 | Ht 68.0 in | Wt 190.2 lb

## 2021-07-30 DIAGNOSIS — K219 Gastro-esophageal reflux disease without esophagitis: Secondary | ICD-10-CM | POA: Insufficient documentation

## 2021-07-30 DIAGNOSIS — Z8042 Family history of malignant neoplasm of prostate: Secondary | ICD-10-CM | POA: Diagnosis not present

## 2021-07-30 DIAGNOSIS — Z85828 Personal history of other malignant neoplasm of skin: Secondary | ICD-10-CM | POA: Insufficient documentation

## 2021-07-30 DIAGNOSIS — C771 Secondary and unspecified malignant neoplasm of intrathoracic lymph nodes: Secondary | ICD-10-CM | POA: Insufficient documentation

## 2021-07-30 DIAGNOSIS — Z79899 Other long term (current) drug therapy: Secondary | ICD-10-CM | POA: Insufficient documentation

## 2021-07-30 DIAGNOSIS — Z803 Family history of malignant neoplasm of breast: Secondary | ICD-10-CM | POA: Diagnosis not present

## 2021-07-30 DIAGNOSIS — Z87891 Personal history of nicotine dependence: Secondary | ICD-10-CM | POA: Diagnosis not present

## 2021-07-30 DIAGNOSIS — C61 Malignant neoplasm of prostate: Secondary | ICD-10-CM

## 2021-07-30 DIAGNOSIS — Z923 Personal history of irradiation: Secondary | ICD-10-CM | POA: Diagnosis not present

## 2021-07-30 NOTE — Progress Notes (Signed)
Radiation Oncology         [phone] ________________________________  Follow Up New  Name: Brendan Anderson MRN: [id number]  Date: 07/30/2021  DOB: [birth year]  VZ:DGLOV, [name], MD  Raynelle Bring, MD   REFERRING PHYSICIAN: Raynelle Bring, MD  DIAGNOSIS: 66 y.o. 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    ICD-10-CM   1. Prostate cancer metastatic to intrapelvic lymph node ([place])  C61    C77.5     2. Malignant neoplasm of prostate metastatic to intrathoracic lymph node ([address])  C61    C77.1        HISTORY OF PRESENT ILLNESS::Brendan Anderson is a 66 y.o. gentleman.  He was initially diagnosed with Gleason 4+5 adenocarcinoma of the prostate on biopsy with Dr. Lerry Liner 04/23/19, in [place], [place], with a PSA of 6.3 at diagnosis.   He was referred to Dr. Alinda Money at [facility] in consideration for robotic prostatectomy.  He underwent staging scans here in [place] on 06/03/2019 with CT A/P and bone scan both negative for evidence of visceral or osseous metastatic disease.  He elected to proceed with RALP with BPLND on 06/27/2019 under the care of Dr. Alinda Money.  Final surgical pathology revealed pT3bN1, Gleason 5+4,  prostatic adenocarcinoma with extraprostatic extension present at right posterior mid, bladder neck, and bilateral posterior base as well as bilateral seminal vesicle invasion and lymphovascular invasion present.  Surgical margins were negative but one out of nine (four right and five left) sampled pelvic lymph nodes were positive for metastatic adenocarcinoma, measuring 1.0 cm with extranodal extension (1/9).  Unfortunately, his postoperative PSA on 09/19/2019 was detectable at 0.2 and increased further to 0.38 on 10/29/2019. He was seen in our multidisciplinary prostate cancer clinic on 11/12/2019 with consensus recommendation for 7.5 weeks of salvage  radiation therapy in combination with short-term ADT. He received radiation 12/02/19 - 01/23/20, concurrent with ADT.  He responded well with undetectable PSA in 04/2020 but the PSA subsequently began to rise again at 0.17 in 10/2020 and 0.46 in 01/2021. His PSA reached 2.04 on 06/08/21, prompting PSMA PET scan, performed on 07/05/21, showing no evidence of local recurrence in the prostatic fossa or pelvic lymph nodes but there were two very small, tracer avid foci of nodal tissue in the left mid mediastinum, along the descending thoracic aorta. No evidence of visceral or skeletal metastasis.  The patient has reviewed his scan results with his urologist and has been kindly referred back to [place] today for discussion of possible radiation therapy to the new thoracic nodes.  PREVIOUS RADIATION THERAPY: Yes 12/02/19 - 01/23/20: 1. The prostate fossa and pelvic lymph nodes were initially treated to 45 Gy in 25 fractions of 1.8 Gy  2. The prostate fossa only was boosted to 68.4 Gy with 13 additional fractions of 1.8 Gy   PAST MEDICAL HISTORY:  has a past medical history of Anxiety, Arthritis, Depression, Family history of breast cancer, Family history of prostate cancer, GERD (gastroesophageal reflux disease), Prostate cancer ([place]), and Skin cancer.    PAST SURGICAL HISTORY: Past Surgical History:  Procedure Laterality Date   BICEPS TENDON REPAIR Left 07/2016   EYE SURGERY Bilateral 2005   LYMPHADENECTOMY Bilateral 06/27/2019   Procedure: LYMPHADENECTOMY, PELVIC;  Surgeon: Raynelle Bring, MD;  Location: [facility];  Service: Urology;  Laterality: Bilateral;   MOLE REMOVAL  2008   pre CA from face   NASAL SINUS SURGERY  2010   ROBOT ASSISTED LAPAROSCOPIC RADICAL PROSTATECTOMY N/A 06/27/2019   Procedure: XI ROBOTIC ASSISTED LAPAROSCOPIC RADICAL PROSTATECTOMY LEVEL 2;  Surgeon: Raynelle Bring, MD;  Location: [facility];  Service: Urology;  Laterality: N/A;   SHOULDER ARTHROSCOPY WITH ROTATOR CUFF REPAIR Left 11/2016     FAMILY HISTORY: family history includes Asthma in his paternal grandmother; Brain cancer in his paternal uncle; Breast cancer in his paternal aunt; Breast cancer (age of onset: 17) in his mother; Cancer in his father; Cancer (age of onset: 27) in his nephew; Heart attack in his maternal grandmother; Leukemia in his mother; Prostate cancer in his paternal grandfather; Stroke in his maternal grandfather.  SOCIAL HISTORY:  reports that he quit smoking about 38 years ago. His smoking use included cigars and cigarettes. He has a 2.50 pack-year smoking history. He has never used smokeless tobacco. He reports current alcohol use. He reports that he does not use drugs.  ALLERGIES: Codeine  MEDICATIONS:  Current Outpatient Medications  Medication Sig Dispense Refill   tizanidine (ZANAFLEX) 2 MG capsule Take 2 mg by mouth 3 (three) times daily.     Al Hyd-Mg Tr-Alg Ac-Sod Bicarb (GAVISCON-2 PO) Take 2 tablets by mouth at bedtime.     cetirizine (ZYRTEC) 10 MG tablet Take 10 mg by mouth daily.     esomeprazole (NEXIUM) 40 MG capsule Take 1 capsule by mouth daily.     Multiple Vitamin (MULTIVITAMIN) tablet Take 1 tablet by mouth daily.     predniSONE (DELTASONE) 10 MG tablet Take 10 mg by mouth daily with breakfast. Patient will finish medication on Wednesday 02/26/20 (Patient not taking: Reported on 07/30/2021)     sildenafil (VIAGRA) 100 MG tablet Take 25 mg by mouth daily as needed for erectile dysfunction.     traZODone (DESYREL) 100 MG tablet Take 100 mg by mouth at bedtime.     No current facility-administered medications for this encounter.    REVIEW OF SYSTEMS:  On review of systems, the patient reports that he is doing well overall. He denies any chest pain, shortness of breath, cough, fevers, chills, night sweats, unintended weight changes. He denies any bowel disturbances, and denies abdominal pain, nausea or vomiting. He denies any new musculoskeletal or joint aches or pains. His IPSS was  6, indicating minimal urinary symptoms. A complete review of systems is obtained and is otherwise negative.   PHYSICAL EXAM:  Wt Readings from Last 3 Encounters:  07/30/21 190 lb 4 oz (86.3 kg)  11/12/19 177 lb 3.2 oz (80.4 kg)  06/27/19 173 lb 4.5 oz (78.6 kg)   Temp Readings from Last 3 Encounters:  07/30/21 (!) 96.7 F (35.9 C) (Temporal)  11/12/19 98.7 F (37.1 C)  06/28/19 98.4 F (36.9 C) (Oral)   BP Readings from Last 3 Encounters:  07/30/21 133/81  11/12/19 138/86  06/28/19 103/77   Pulse Readings from Last 3 Encounters:  07/30/21 68  11/12/19 77  06/28/19 77   Pain Assessment Pain Score: 0-No pain/10  In general this is a well appearing Caucasian male in no acute distress. He's alert and oriented x4 and appropriate throughout the examination. Cardiopulmonary assessment is negative for acute distress and he exhibits normal effort.    KPS = 100  100 - Normal; no complaints; no evidence of disease. 90   - Able to carry on normal activity; minor signs or symptoms of disease. 80   - Normal activity with effort; some signs or symptoms of disease. 70   - Cares  for self; unable to carry on normal activity or to do active work. 60   - Requires occasional assistance, but is able to care for most of his personal needs. 50   - Requires considerable assistance and frequent medical care. 61   - Disabled; requires special care and assistance. 34   - Severely disabled; hospital admission is indicated although death not imminent. 62   - Very sick; hospital admission necessary; active supportive treatment necessary. 10   - Moribund; fatal processes progressing rapidly. 0     - Dead  Karnofsky DA, Abelmann [place], Craver LS and Burchenal [facility] [phone]) The use of the nitrogen mustards in the palliative treatment of carcinoma: with particular reference to bronchogenic carcinoma Cancer 1 634-56   LABORATORY DATA:  Lab Results  Component Value Date   WBC 5.3 06/19/2019   HGB 11.1 (L)  06/28/2019   HCT 33.8 (L) 06/28/2019   MCV 90.5 06/19/2019   PLT 185 06/19/2019   Lab Results  Component Value Date   NA 141 06/19/2019   K 4.1 06/19/2019   CL 106 06/19/2019   CO2 26 06/19/2019   No results found for: ALT, AST, GGT, ALKPHOS, BILITOT   RADIOGRAPHY: NM PET (PSMA) SKULL TO MID THIGH  Result Date: 07/05/2021 CLINICAL DATA:  Prostate carcinoma with biochemical recurrence. Prior radical prostatectomy and salvage radiation therapy. PSA equal 2.04 EXAM: NUCLEAR MEDICINE PET SKULL BASE TO THIGH TECHNIQUE: 9.38 mCi F18 Piflufolastat (Pylarify) was injected intravenously. Full-ring PET imaging was performed from the skull base to thigh after the radiotracer. CT data was obtained and used for attenuation correction and anatomic localization. COMPARISON:  CT abdomen 05/28/2020 FINDINGS: NECK No radiotracer activity in neck lymph nodes. Incidental CT finding: None CHEST Two small radiotracer avid lymph nodes in the LEFT aspect of the medial mediastinum along the descending thoracic aorta. More superior node at in level of the LEFT mainstem bronchus with SUV max equal 3.1 on image 89. More inferior node position between the esophagus and aorta on image 108 measures 2-3 mm with SUV max equal 3.1. No suspicious pulmonary nodules. Incidental CT finding: None ABDOMEN/PELVIS Prostate: No focal activity in the prostatectomy bed. Lymph nodes: No abnormal radiotracer accumulation within pelvic or abdominal nodes. Liver: No evidence of liver metastasis Incidental CT finding: Atherosclerotic calcification of the aorta. SKELETON No focal  activity to suggest skeletal metastasis. IMPRESSION: 1. No evidence local prostate cancer recurrence in the prostatectomy bed. 2. Two very small presumed foci of nodal tissue in the LEFT middle mediastinum along the descending thoracic aorta have radiotracer activity. Findings concerning for nodal prostate cancer metastasis. 3. No evidence of local nodal metastasis in the  pelvis. 4. No evidence of visceral metastasis or skeletal metastasis. Electronically Signed   By: Suzy Bouchard M.D.   On: 07/05/2021 17:03      IMPRESSION/PLAN: 66 y.o. 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  Today, we talked to the patient and family about the findings and workup thus far. We discussed the natural history of prostate cancer and general treatment, highlighting the role of radiotherapy in the management of oligometastatic disease. We discussed the available radiation techniques, and focused on the details and logistics of delivery.  The recommendation is for a 10 fraction course of stereotactic body radiotherapy (SBRT) targeting the PET positive intrathoracic lymph nodes.  We reviewed the anticipated acute and late sequelae associated with  radiation in this setting. The patient was encouraged to ask questions that were answered to his stated satisfaction.  At the end of the conversation, the patient is interested in moving forward with the recommended 10 fraction course of stereotactic body radiotherapy (SBRT) targeting the PET positive intrathoracic lymph nodes.  He appears to have a good understanding of his disease and our treatment recommendations which are of curative intent.  He has freely signed written consent to proceed today in the office and a copy of this document will be placed in his medical record.  We will share our discussion with Dr. Alinda Money and proceed with coordinating for CT simulation/treatment planning early next week, in anticipation of beginning his radiation treatments in the very near future.  We enjoyed meeting with him and his wife again today and look forward to continuing to participate in his care.   We personally spent 45 minutes in this encounter including chart review, reviewing radiological studies, meeting face-to-face with the  patient, entering orders and completing documentation.    Nicholos Johns, PA-C    Tyler Pita, MD  [facility] Direct Dial: [phone]  Fax: [phone] [email or web address]  Skype  [email or web address]   This document serves as a record of services personally performed by Tyler Pita, MD and Freeman Caldron, PA-C. It was created on their behalf by Wilburn Mylar, a trained medical scribe. The creation of this record is based on the scribe's personal observations and the provider's statements to them. This document has been checked and approved by the attending provider.

## 2021-07-30 NOTE — Progress Notes (Addendum)
Follow-up-new appointment. I verified patient identity and began nursing interview w/ spouse Koy Lamp in attendance. Patient reports anxiety about treatment, but otherwise doing well. No other issues reported at this time.  Meaningful use complete. I-PSS score of 6-mild. No urinary management medications. Urology appointment-10/29/21 w/ [facility].  BP 133/81 (BP Location: Left Arm, Patient Position: Sitting, Cuff Size: Normal)   Pulse 68   Temp (!) 96.7 F (35.9 C) (Temporal)   Resp 18   Ht '5\' 8"'$  (1.727 m)   Wt 190 lb 4 oz (86.3 kg)   SpO2 99%   BMI 28.93 kg/m

## 2021-08-02 NOTE — Progress Notes (Signed)
  Radiation Oncology         [phone] ________________________________  Name: Brendan Anderson MRN: [id number]  Date: 08/04/2021  DOB: January 08, 1956  ULTRAHYPOFRACTIONATED RADIOTHERAPY SIMULATION AND TREATMENT PLANNING NOTE    ICD-10-CM   1. 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  NARRATIVE:  The patient was brought to the [facility].  Identity was confirmed.  All relevant records and images related to the planned course of therapy were reviewed.  The patient freely provided informed written consent to proceed with treatment after reviewing the details related to the planned course of therapy. The consent form was witnessed and verified by the simulation staff.  Then, the patient was set-up in a stable reproducible  supine position for radiation therapy.  A BodyFix immobilization pillow was fabricated for reproducible positioning.  Surface markings were placed.  The CT images were loaded into the planning software.  The gross target volumes (GTV) and planning target volumes (PTV) were delinieated, and avoidance structures were contoured.  Treatment planning then occurred.  The radiation prescription was entered and confirmed.  A total of two complex treatment devices were fabricated in the form of the BodyFix immobilization pillow and a neck accuform cushion.  I have requested : 3D Simulation  I have requested a DVH of the following structures: targets and all normal structures near the target including lungs, heart, spinal cord and skin as noted on the radiation plan to maintain doses in adherence with established limits  SPECIAL TREATMENT PROCEDURE:  The planned course of therapy using radiation constitutes a special treatment procedure.  Special care is required in the management of this patient for the following reasons. High dose per fraction requiring special monitoring for increased toxicities of treatment including daily imaging..  The special nature of the planned course of radiotherapy will require increased physician supervision and oversight to ensure patient's safety with optimal treatment outcomes.    This requires extended time and effort.    PLAN:  The patient will receive 50 Gy in 10 fractions.  ________________________________  Sheral Apley Tammi Klippel, M.D.

## 2021-08-04 ENCOUNTER — Ambulatory Visit
Admission: RE | Admit: 2021-08-04 | Discharge: 2021-08-04 | Disposition: A | Discharge status: Home / Self Care | Payer: Medicare Other | Source: Ambulatory Visit | Attending: Radiation Oncology | Admitting: Radiation Oncology

## 2021-08-04 DIAGNOSIS — C61 Malignant neoplasm of prostate: Secondary | ICD-10-CM | POA: Diagnosis present

## 2021-08-04 DIAGNOSIS — C771 Secondary and unspecified malignant neoplasm of intrathoracic lymph nodes: Secondary | ICD-10-CM | POA: Insufficient documentation

## 2021-08-13 DIAGNOSIS — C771 Secondary and unspecified malignant neoplasm of intrathoracic lymph nodes: Secondary | ICD-10-CM | POA: Insufficient documentation

## 2021-08-13 DIAGNOSIS — C61 Malignant neoplasm of prostate: Secondary | ICD-10-CM | POA: Insufficient documentation

## 2021-08-16 ENCOUNTER — Ambulatory Visit
Admission: RE | Admit: 2021-08-16 | Discharge: 2021-08-16 | Disposition: A | Discharge status: Home / Self Care | Payer: Medicare Other | Source: Ambulatory Visit | Attending: Radiation Oncology | Admitting: Radiation Oncology

## 2021-08-16 ENCOUNTER — Other Ambulatory Visit: Payer: Self-pay

## 2021-08-16 DIAGNOSIS — C771 Secondary and unspecified malignant neoplasm of intrathoracic lymph nodes: Secondary | ICD-10-CM | POA: Insufficient documentation

## 2021-08-16 DIAGNOSIS — Z510 Encounter for antineoplastic radiation therapy: Secondary | ICD-10-CM | POA: Insufficient documentation

## 2021-08-16 DIAGNOSIS — C61 Malignant neoplasm of prostate: Secondary | ICD-10-CM | POA: Diagnosis present

## 2021-08-16 LAB — RAD ONC ARIA SESSION SUMMARY
Course Elapsed Days: 0
Plan Fractions Treated to Date: 1
Plan Prescribed Dose Per Fraction: 5 Gy
Plan Total Fractions Prescribed: 10
Plan Total Prescribed Dose: 50 Gy
Reference Point Dosage Given to Date: 5 Gy
Reference Point Session Dosage Given: 5 Gy
Session Number: 1

## 2021-08-16 NOTE — Progress Notes (Signed)
  Radiation Oncology         [phone] ________________________________  Name: Brendan Anderson MRN: [id number]  Date: 08/16/2021  DOB: 03/08/1956  Ultrahypofractionated Radiotherapy Treatment Procedure Note    ICD-10-CM   1. Malignant neoplasm of prostate metastatic to intrathoracic lymph node ([place])  C61    C77.1       CURRENT FRACTION:    1  PLANNED FRACTIONS:  10  NARRATIVE:  Brendan Anderson was brought to the stereotactic radiation treatment machine and placed supine on the treatment couch. The patient was set up for stereotactic body radiotherapy on the body fix pillow.  PLANNING AND DOSIMETRY:  The patient's radiation plan was reviewed and approved prior to starting treatment.  It showed 3-dimensional radiation distributions overlaid onto the planning CT.  The [facility] for the target structures as well as the organs at risk were reviewed. The documentation of this is filed in the radiation oncology EMR.  SIMULATION VERIFICATION:  The patient underwent CT imaging on the treatment unit.  These were carefully aligned to document that the ablative radiation dose would cover the target volume and maximally spare the nearby organs at risk according to the planned distribution.  SPECIAL TREATMENT PROCEDURE: Brendan Anderson received high dose ablative UHRT to the planned target volume without unforeseen complications. Treatment was delivered uneventfully. The high doses associated with stereotactic body radiotherapy and the significant potential risks require careful treatment set up and patient monitoring constituting a special treatment procedure   STEREOTACTIC TREATMENT MANAGEMENT:  Following delivery, the patient was evaluated clinically. The patient tolerated treatment without significant acute effects, and was discharged to home in stable condition.    PLAN: Continue treatment as planned.  ________________________________  Sheral Apley. Tammi Klippel, M.D.

## 2021-08-17 ENCOUNTER — Other Ambulatory Visit: Payer: Self-pay

## 2021-08-17 ENCOUNTER — Ambulatory Visit
Admission: RE | Admit: 2021-08-17 | Discharge: 2021-08-17 | Disposition: A | Discharge status: Home / Self Care | Payer: Medicare Other | Source: Ambulatory Visit | Attending: Radiation Oncology | Admitting: Radiation Oncology

## 2021-08-17 DIAGNOSIS — Z510 Encounter for antineoplastic radiation therapy: Secondary | ICD-10-CM | POA: Diagnosis not present

## 2021-08-17 LAB — RAD ONC ARIA SESSION SUMMARY
Course Elapsed Days: 1
Plan Fractions Treated to Date: 2
Plan Prescribed Dose Per Fraction: 5 Gy
Plan Total Fractions Prescribed: 10
Plan Total Prescribed Dose: 50 Gy
Reference Point Dosage Given to Date: 10 Gy
Reference Point Session Dosage Given: 5 Gy
Session Number: 2

## 2021-08-18 ENCOUNTER — Other Ambulatory Visit: Payer: Self-pay

## 2021-08-18 ENCOUNTER — Ambulatory Visit
Admission: RE | Admit: 2021-08-18 | Discharge: 2021-08-18 | Disposition: A | Discharge status: Home / Self Care | Payer: Medicare Other | Source: Ambulatory Visit | Attending: Radiation Oncology | Admitting: Radiation Oncology

## 2021-08-18 DIAGNOSIS — Z510 Encounter for antineoplastic radiation therapy: Secondary | ICD-10-CM | POA: Diagnosis not present

## 2021-08-18 LAB — RAD ONC ARIA SESSION SUMMARY
Course Elapsed Days: 2
Plan Fractions Treated to Date: 3
Plan Prescribed Dose Per Fraction: 5 Gy
Plan Total Fractions Prescribed: 10
Plan Total Prescribed Dose: 50 Gy
Reference Point Dosage Given to Date: 15 Gy
Reference Point Session Dosage Given: 5 Gy
Session Number: 3

## 2021-08-19 ENCOUNTER — Other Ambulatory Visit: Payer: Self-pay

## 2021-08-19 ENCOUNTER — Ambulatory Visit
Admission: RE | Admit: 2021-08-19 | Discharge: 2021-08-19 | Disposition: A | Discharge status: Home / Self Care | Payer: Medicare Other | Source: Ambulatory Visit | Attending: Radiation Oncology | Admitting: Radiation Oncology

## 2021-08-19 DIAGNOSIS — Z510 Encounter for antineoplastic radiation therapy: Secondary | ICD-10-CM | POA: Diagnosis not present

## 2021-08-19 LAB — RAD ONC ARIA SESSION SUMMARY
Course Elapsed Days: 3
Plan Fractions Treated to Date: 4
Plan Prescribed Dose Per Fraction: 5 Gy
Plan Total Fractions Prescribed: 10
Plan Total Prescribed Dose: 50 Gy
Reference Point Dosage Given to Date: 20 Gy
Reference Point Session Dosage Given: 5 Gy
Session Number: 4

## 2021-08-20 ENCOUNTER — Ambulatory Visit
Admission: RE | Admit: 2021-08-20 | Discharge: 2021-08-20 | Disposition: A | Discharge status: Home / Self Care | Payer: Medicare Other | Source: Ambulatory Visit | Attending: Radiation Oncology | Admitting: Radiation Oncology

## 2021-08-20 ENCOUNTER — Other Ambulatory Visit: Payer: Self-pay

## 2021-08-20 DIAGNOSIS — C771 Secondary and unspecified malignant neoplasm of intrathoracic lymph nodes: Secondary | ICD-10-CM | POA: Diagnosis not present

## 2021-08-20 LAB — RAD ONC ARIA SESSION SUMMARY
Course Elapsed Days: 4
Plan Fractions Treated to Date: 5
Plan Prescribed Dose Per Fraction: 5 Gy
Plan Total Fractions Prescribed: 10
Plan Total Prescribed Dose: 50 Gy
Reference Point Dosage Given to Date: 25 Gy
Reference Point Session Dosage Given: 5 Gy
Session Number: 5

## 2021-08-23 ENCOUNTER — Ambulatory Visit
Admission: RE | Admit: 2021-08-23 | Discharge: 2021-08-23 | Disposition: A | Discharge status: Home / Self Care | Payer: Medicare Other | Source: Ambulatory Visit | Attending: Radiation Oncology | Admitting: Radiation Oncology

## 2021-08-23 ENCOUNTER — Other Ambulatory Visit: Payer: Self-pay

## 2021-08-23 DIAGNOSIS — Z510 Encounter for antineoplastic radiation therapy: Secondary | ICD-10-CM | POA: Diagnosis not present

## 2021-08-23 LAB — RAD ONC ARIA SESSION SUMMARY
Course Elapsed Days: 7
Plan Fractions Treated to Date: 6
Plan Prescribed Dose Per Fraction: 5 Gy
Plan Total Fractions Prescribed: 10
Plan Total Prescribed Dose: 50 Gy
Reference Point Dosage Given to Date: 30 Gy
Reference Point Session Dosage Given: 5 Gy
Session Number: 6

## 2021-08-24 ENCOUNTER — Other Ambulatory Visit: Payer: Self-pay

## 2021-08-24 ENCOUNTER — Ambulatory Visit
Admission: RE | Admit: 2021-08-24 | Discharge: 2021-08-24 | Disposition: A | Discharge status: Home / Self Care | Payer: Medicare Other | Source: Ambulatory Visit | Attending: Radiation Oncology | Admitting: Radiation Oncology

## 2021-08-24 DIAGNOSIS — C771 Secondary and unspecified malignant neoplasm of intrathoracic lymph nodes: Secondary | ICD-10-CM | POA: Diagnosis not present

## 2021-08-24 LAB — RAD ONC ARIA SESSION SUMMARY
Course Elapsed Days: 8
Plan Fractions Treated to Date: 7
Plan Prescribed Dose Per Fraction: 5 Gy
Plan Total Fractions Prescribed: 10
Plan Total Prescribed Dose: 50 Gy
Reference Point Dosage Given to Date: 35 Gy
Reference Point Session Dosage Given: 5 Gy
Session Number: 7

## 2021-08-25 ENCOUNTER — Other Ambulatory Visit: Payer: Self-pay

## 2021-08-25 ENCOUNTER — Ambulatory Visit
Admission: RE | Admit: 2021-08-25 | Discharge: 2021-08-25 | Disposition: A | Discharge status: Home / Self Care | Payer: Medicare Other | Source: Ambulatory Visit | Attending: Radiation Oncology | Admitting: Radiation Oncology

## 2021-08-25 DIAGNOSIS — Z510 Encounter for antineoplastic radiation therapy: Secondary | ICD-10-CM | POA: Diagnosis not present

## 2021-08-25 LAB — RAD ONC ARIA SESSION SUMMARY
Course Elapsed Days: 9
Plan Fractions Treated to Date: 8
Plan Prescribed Dose Per Fraction: 5 Gy
Plan Total Fractions Prescribed: 10
Plan Total Prescribed Dose: 50 Gy
Reference Point Dosage Given to Date: 40 Gy
Reference Point Session Dosage Given: 5 Gy
Session Number: 8

## 2021-08-26 ENCOUNTER — Other Ambulatory Visit: Payer: Self-pay

## 2021-08-26 ENCOUNTER — Ambulatory Visit
Admission: RE | Admit: 2021-08-26 | Discharge: 2021-08-26 | Disposition: A | Discharge status: Home / Self Care | Payer: Medicare Other | Source: Ambulatory Visit | Attending: Radiation Oncology | Admitting: Radiation Oncology

## 2021-08-26 DIAGNOSIS — Z510 Encounter for antineoplastic radiation therapy: Secondary | ICD-10-CM | POA: Diagnosis not present

## 2021-08-26 LAB — RAD ONC ARIA SESSION SUMMARY
Course Elapsed Days: 10
Plan Fractions Treated to Date: 9
Plan Prescribed Dose Per Fraction: 5 Gy
Plan Total Fractions Prescribed: 10
Plan Total Prescribed Dose: 50 Gy
Reference Point Dosage Given to Date: 45 Gy
Reference Point Session Dosage Given: 5 Gy
Session Number: 9

## 2021-08-27 ENCOUNTER — Encounter: Payer: Self-pay | Admitting: Urology

## 2021-08-27 ENCOUNTER — Other Ambulatory Visit: Payer: Self-pay

## 2021-08-27 ENCOUNTER — Ambulatory Visit
Admission: RE | Admit: 2021-08-27 | Discharge: 2021-08-27 | Disposition: A | Discharge status: Home / Self Care | Payer: Medicare Other | Source: Ambulatory Visit | Attending: Radiation Oncology | Admitting: Radiation Oncology

## 2021-08-27 DIAGNOSIS — Z510 Encounter for antineoplastic radiation therapy: Secondary | ICD-10-CM | POA: Diagnosis not present

## 2021-08-27 DIAGNOSIS — C61 Malignant neoplasm of prostate: Secondary | ICD-10-CM

## 2021-08-27 LAB — RAD ONC ARIA SESSION SUMMARY
Course Elapsed Days: 11
Plan Fractions Treated to Date: 10
Plan Prescribed Dose Per Fraction: 5 Gy
Plan Total Fractions Prescribed: 10
Plan Total Prescribed Dose: 50 Gy
Reference Point Dosage Given to Date: 50 Gy
Reference Point Session Dosage Given: 5 Gy
Session Number: 10

## 2021-09-02 ENCOUNTER — Encounter: Payer: Self-pay | Admitting: Urology

## 2021-09-02 ENCOUNTER — Other Ambulatory Visit: Payer: Self-pay | Admitting: Urology

## 2021-09-02 MED ORDER — SUCRALFATE 1 G PO TABS: 1.0000 g | ORAL_TABLET | Freq: Three times a day (TID) | ORAL | 1 refills | Status: DC

## 2021-09-03 ENCOUNTER — Telehealth: Payer: Self-pay

## 2021-09-05 ENCOUNTER — Emergency Department (HOSPITAL_COMMUNITY)
Admission: EM | Admit: 2021-09-05 | Discharge: 2021-09-06 | Disposition: A | Discharge status: Home / Self Care | Payer: Medicare Other | Attending: Emergency Medicine | Admitting: Emergency Medicine

## 2021-09-05 ENCOUNTER — Emergency Department (HOSPITAL_COMMUNITY): Payer: Medicare Other

## 2021-09-05 ENCOUNTER — Encounter (HOSPITAL_COMMUNITY): Payer: Self-pay | Admitting: Emergency Medicine

## 2021-09-05 DIAGNOSIS — R638 Other symptoms and signs concerning food and fluid intake: Secondary | ICD-10-CM | POA: Insufficient documentation

## 2021-09-05 DIAGNOSIS — R531 Weakness: Secondary | ICD-10-CM | POA: Insufficient documentation

## 2021-09-05 DIAGNOSIS — D72819 Decreased white blood cell count, unspecified: Secondary | ICD-10-CM | POA: Diagnosis not present

## 2021-09-05 DIAGNOSIS — D75839 Thrombocytosis, unspecified: Secondary | ICD-10-CM | POA: Insufficient documentation

## 2021-09-05 DIAGNOSIS — Z8546 Personal history of malignant neoplasm of prostate: Secondary | ICD-10-CM | POA: Insufficient documentation

## 2021-09-05 DIAGNOSIS — R079 Chest pain, unspecified: Secondary | ICD-10-CM | POA: Diagnosis present

## 2021-09-05 LAB — CBC
HCT: 41.7 % (ref 39.0–52.0)
Hemoglobin: 14.7 g/dL (ref 13.0–17.0)
MCH: 30.6 pg (ref 26.0–34.0)
MCHC: 35.3 g/dL (ref 30.0–36.0)
MCV: 86.9 fL (ref 80.0–100.0)
Platelets: 124 10*3/uL — ABNORMAL LOW (ref 150–400)
RBC: 4.8 MIL/uL (ref 4.22–5.81)
RDW: 12.3 % (ref 11.5–15.5)
WBC: 3.7 10*3/uL — ABNORMAL LOW (ref 4.0–10.5)
nRBC: 0 % (ref 0.0–0.2)

## 2021-09-05 LAB — BASIC METABOLIC PANEL
Anion gap: 10 (ref 5–15)
BUN: 19 mg/dL (ref 8–23)
CO2: 24 mmol/L (ref 22–32)
Calcium: 8.9 mg/dL (ref 8.9–10.3)
Chloride: 104 mmol/L (ref 98–111)
Creatinine, Ser: 1.22 mg/dL (ref 0.61–1.24)
GFR, Estimated: >60 mL/min (ref >60)
Glucose, Bld: 134 mg/dL — ABNORMAL HIGH (ref 70–99)
Potassium: 3.9 mmol/L (ref 3.5–5.1)
Sodium: 138 mmol/L (ref 135–145)

## 2021-09-05 LAB — TROPONIN I (HIGH SENSITIVITY): Troponin I (High Sensitivity): 6 ng/L (ref <18)

## 2021-09-06 DIAGNOSIS — R079 Chest pain, unspecified: Secondary | ICD-10-CM | POA: Diagnosis not present

## 2021-09-06 LAB — HEPATIC FUNCTION PANEL
ALT: 15 U/L (ref 0–44)
AST: 14 U/L — ABNORMAL LOW (ref 15–41)
Albumin: 3.7 g/dL (ref 3.5–5.0)
Alkaline Phosphatase: 53 U/L (ref 38–126)
Bilirubin, Direct: 0.1 mg/dL (ref 0.0–0.2)
Indirect Bilirubin: 0.7 mg/dL (ref 0.3–0.9)
Total Bilirubin: 0.8 mg/dL (ref 0.3–1.2)
Total Protein: 6.9 g/dL (ref 6.5–8.1)

## 2021-09-06 LAB — TROPONIN I (HIGH SENSITIVITY): Troponin I (High Sensitivity): 7 ng/L (ref <18)

## 2021-09-06 LAB — LIPASE, BLOOD: Lipase: 23 U/L (ref 11–51)

## 2021-09-06 MED ORDER — LIDOCAINE VISCOUS HCL 2 % MT SOLN
15.0000 mL | Freq: Once | OROMUCOSAL | Status: AC
  Administered 2021-09-06: 15 mL via ORAL
  Filled 2021-09-06: qty 15

## 2021-09-06 MED ORDER — SODIUM CHLORIDE 0.9 % IV BOLUS
500.0000 mL | Freq: Once | INTRAVENOUS | Status: AC
  Administered 2021-09-06: 500 mL via INTRAVENOUS

## 2021-09-06 MED ORDER — HYDROCODONE-ACETAMINOPHEN 5-325 MG PO TABS: 1.0000 | ORAL_TABLET | Freq: Four times a day (QID) | ORAL | 0 refills | Status: DC | PRN

## 2021-09-06 MED ORDER — MORPHINE SULFATE (PF) 4 MG/ML IV SOLN
4.0000 mg | Freq: Once | INTRAVENOUS | Status: AC
  Administered 2021-09-06: 4 mg via INTRAVENOUS
  Filled 2021-09-06: qty 1

## 2021-09-06 MED ORDER — ALUM & MAG HYDROXIDE-SIMETH 200-200-20 MG/5ML PO SUSP
30.0000 mL | Freq: Once | ORAL | Status: AC
Start: 2021-09-06 — End: 2021-09-06
  Administered 2021-09-06: 30 mL via ORAL
  Filled 2021-09-06: qty 30

## 2021-09-06 MED ORDER — ONDANSETRON HCL 4 MG/2ML IJ SOLN
4.0000 mg | Freq: Once | INTRAMUSCULAR | Status: AC
  Administered 2021-09-06: 4 mg via INTRAVENOUS
  Filled 2021-09-06: qty 2

## 2021-09-06 MED ORDER — PANTOPRAZOLE SODIUM 40 MG IV SOLR
40.0000 mg | Freq: Once | INTRAVENOUS | Status: AC
Start: 2021-09-06 — End: 2021-09-06
  Administered 2021-09-06: 40 mg via INTRAVENOUS
  Filled 2021-09-06: qty 10

## 2021-09-06 MED ORDER — SUCRALFATE 1 GM/10ML PO SUSP: 1.0000 g | Freq: Three times a day (TID) | ORAL | 0 refills | Status: DC

## 2021-09-06 MED ORDER — ALUMINUM & MAGNESIUM HYDROXIDE 200-200 MG/5ML PO SUSP: 15.0000 mL | Freq: Four times a day (QID) | ORAL | 0 refills | Status: DC | PRN

## 2021-09-06 MED ORDER — LIDOCAINE VISCOUS HCL 2 % MT SOLN: 15.0000 mL | Freq: Four times a day (QID) | OROMUCOSAL | 0 refills | Status: DC | PRN

## 2021-09-06 NOTE — ED Provider Notes (Signed)
[facility]-EMERGENCY DEPT Provider Note   CSN: [id number] Arrival date & time: 09/05/21  2120     History  Chief Complaint  Patient presents with   Chest Pain    Brendan Anderson is a 66 y.o. 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  Patient reports he just completed radiation therapy to the chest for his thoracic lymph nodes on 06/16.  He has been having some chest pain described as indigestion since then however this seemed to get much worse over the past few days.  He was told by his urologist that this was likely to occur.  He states now the pain is fairly constant, it is much worse with swallowing, no alleviating factors.  Has had decreased p.o. intake with this resulting with some generalized weakness.  He has tried utilizing Carafate pills without much relief, he is also taking some leftover hydrocodone which he is almost out of which gives him some mild temporary relief.  He denies vomiting, abdominal pain, fever, dyspnea, hemoptysis, leg pain/swelling, history of VTE, or recent chemotherapy.  HPI     Home Medications Prior to Admission medications   Medication Sig Start Date End Date Taking? Authorizing Provider  Al Hyd-Mg Tr-Alg Ac-Sod Bicarb (GAVISCON-2 PO) Take 2 tablets by mouth at bedtime.   Yes [provider]  Calcium Carbonate Antacid 400 MG CHEW Chew 800 mg by mouth 2 (two) times daily.   Yes [provider]  cetirizine (ZYRTEC) 10 MG tablet Take 10 mg by mouth daily as needed for allergies.   Yes [provider]  denosumab (PROLIA) 60 MG/ML SOSY injection Inject 60 mg into the skin every 6 (six) months.   Yes [provider]  esomeprazole (NEXIUM) 40 MG capsule Take 40 mg by mouth daily. 09/09/19  Yes [provider]   HYDROcodone-acetaminophen (NORCO/VICODIN) 5-325 MG tablet Take 1-2 tablets by mouth every 6 (six) hours as needed for moderate pain.   Yes [provider]  meloxicam (MOBIC) 7.5 MG tablet Take 7.5 mg by mouth 2 (two) times daily. 08/14/21  Yes [provider]  Multiple Vitamin (MULTIVITAMIN) tablet Take 1 tablet by mouth daily.   Yes [provider]  Multiple Vitamins-Minerals (MULTIVITAMIN ADULT, MINERALS,) TABS Take 1 tablet by mouth daily.   Yes [provider]  Multiple Vitamins-Minerals (ZINC PO) Take 1 tablet by mouth every other day.   Yes [provider]  sildenafil (VIAGRA) 100 MG tablet Take 25 mg by mouth daily as needed for erectile dysfunction. 08/03/18  Yes [provider]  sucralfate (CARAFATE) 1 g tablet Take 1 tablet (1 g total) by mouth 4 (four) times daily -  with meals and at bedtime. Crush and dissolve tablet in 4-6 oz. of water and drink to coat the esophagus. 09/02/21  Yes Bruning, Ashlyn, PA-C  tizanidine (ZANAFLEX) 2 MG capsule Take 2 mg by mouth daily as needed for muscle spasms.   Yes [provider]  traZODone (DESYREL) 100 MG tablet Take 100 mg by mouth at bedtime. 05/26/19  Yes [provider]  Vitamin D, Ergocalciferol, (DRISDOL) 1.25 MG (50000 UNIT) CAPS capsule Take 50,000 Units by mouth once a week. Wednesday 07/01/21  Yes [provider]  ascorbic acid (VITAMIN C) 250 MG tablet Take 500 mg by mouth daily at 12 noon.  [provider]      Allergies    Codeine    Review of Systems   Review of Systems  Constitutional:  Positive for appetite change. Negative for fever.  Respiratory:  Negative for shortness of breath.   Cardiovascular:  Positive for chest pain.  Gastrointestinal:  Negative for abdominal pain, blood in stool, diarrhea and vomiting.  Neurological:  Positive for weakness. Negative for syncope.  All other systems reviewed and are negative.   Physical  Exam Updated Vital Signs BP 133/85   Pulse 90   Temp 98.9 F (37.2 C) (Oral)   Resp 19   Ht 5\' 8"  (1.727 m)   Wt 78.9 kg   SpO2 96%   BMI 26.46 kg/m  Physical Exam Vitals and nursing note reviewed.  Constitutional:      General: He is not in acute distress.    Appearance: He is well-developed. He is not toxic-appearing.  HENT:     Head: Normocephalic and atraumatic.     Mouth/Throat:     Pharynx: Oropharynx is clear.     Comments: Posterior oropharynx is symmetric appearing. Patient tolerating own secretions without difficulty. No trismus. No drooling. No hot potato voice. No swelling beneath the tongue, submandibular compartment is soft.  Eyes:     General:        Right eye: No discharge.        Left eye: No discharge.     Conjunctiva/sclera: Conjunctivae normal.  Cardiovascular:     Rate and Rhythm: Normal rate and regular rhythm.  Pulmonary:     Effort: No respiratory distress.     Breath sounds: Normal breath sounds. No wheezing or rales.  Chest:     Chest wall: No tenderness.  Abdominal:     General: There is no distension.     Palpations: Abdomen is soft.     Tenderness: There is no abdominal tenderness. There is no guarding or rebound.  Musculoskeletal:     Cervical back: Neck supple.     Right lower leg: No tenderness. No edema.     Left lower leg: No tenderness. No edema.  Skin:    General: Skin is warm and dry.  Neurological:     Mental Status: He is alert.     Comments: Clear speech.   Psychiatric:        Behavior: Behavior normal.     ED Results / Procedures / Treatments   Labs (all labs ordered are listed, but only abnormal results are displayed) Labs Reviewed  BASIC METABOLIC PANEL - Abnormal; Notable for the following components:      Result Value   Glucose, Bld 134 (*)    All other components within normal limits  CBC - Abnormal; Notable for the following components:   WBC 3.7 (*)    Platelets 124 (*)    All other components within normal  limits  HEPATIC FUNCTION PANEL - Abnormal; Notable for the following components:   AST 14 (*)    All other components within normal limits  LIPASE, BLOOD  TROPONIN I (HIGH SENSITIVITY)  TROPONIN I (HIGH SENSITIVITY)    EKG EKG Interpretation  Date/Time:  Sunday September 05 2021 21:36:49 EDT Ventricular Rate:  97 PR Interval:  134 QRS Duration: 101 QT Interval:  347 QTC Calculation: 441 R Axis:   262 Text Interpretation: Sinus rhythm LAD, consider left anterior fascicular block Borderline T wave abnormalities Baseline wander in lead(s) I III aVL V2 V6 Confirmed by Geoffery Lyons (  [id number]) on 09/05/2021 11:00:46 PM  Radiology DG Chest 2 View  Result Date: 09/05/2021 CLINICAL DATA:  Chest pain EXAM: CHEST - 2 VIEW COMPARISON:  None Available. FINDINGS: The heart size and mediastinal contours are within normal limits. Both lungs are clear. The visualized skeletal structures are unremarkable. IMPRESSION: No active cardiopulmonary disease. Electronically Signed   By: Helyn Numbers M.D.   On: 09/05/2021 21:56    Procedures Procedures    Medications Ordered in ED Medications  sodium chloride 0.9 % bolus 500 mL (500 mLs Intravenous New Bag/Given 09/06/21 0100)  morphine (PF) 4 MG/ML injection 4 mg (4 mg Intravenous Given 09/06/21 0104)  ondansetron (ZOFRAN) injection 4 mg (4 mg Intravenous Given 09/06/21 0105)  pantoprazole (PROTONIX) injection 40 mg (40 mg Intravenous Given 09/06/21 0107)  alum & mag hydroxide-simeth (MAALOX/MYLANTA) 200-200-20 MG/5ML suspension 30 mL (30 mLs Oral Given 09/06/21 0109)    And  lidocaine (XYLOCAINE) 2 % viscous mouth solution 15 mL (15 mLs Oral Given 09/06/21 0109)    ED Course/ Medical Decision Making/ A&P                           Medical Decision Making Amount and/or Complexity of Data Reviewed Labs: ordered. Radiology: ordered.  Risk OTC drugs. Prescription drug management.   Patient presents to the emergency department with chest pain. Patient  nontoxic appearing, in no apparent distress, vitals without significant abnormality. Fairly benign physical exam.  Abdomen nontender without peritoneal signs.  DDX including but not limited to: ACS, pulmonary embolism, dissection, pneumothorax, pneumonia, arrhythmia, severe anemia, MSK, GERD, anxiety, abdominal process, side effect from radiation therapy.   Additional history obtained:  Chart & nursing note reviewed.   EKG: Sinus rhythm LAD, consider left anterior fascicular block Borderline T wave abnormalities Baseline wander in lead(s) I III aVL V2 V6   Lab Tests:  I reviewed & interpreted labs including:  CBC: Mild leukopenia and thrombocytosis pia, no anemia BMP: Fairly unremarkable Hepatic function panel: No significant derangements Lipase: Within normal limits Troponin: Flat  Imaging Studies ordered:  I ordered and viewed the following imaging, agree with radiologist impression:  CXR: No active cardiopulmonary disease.  ED Course:  I ordered medications including morphine for pain, Zofran for nausea, IV Protonix for acidity, and GI cocktail for pain.  Additionally ordered fluids for hydration.  02:40: RE-EVAL: Patient resting comfortably, states he feels much better, feels ready to go home.  EKG without obvious acute ischemia, delta troponin negative, low suspicion for ACS.  Patient is not hypoxic, no pleuritic pain, llow suspicion for pulmonary embolism. Pain is not a tearing sensation, symmetric pulses, no widening of mediastinum on CXR, doubt dissection.  Cardiac monitor reviewed and reassuring.  Labs without critical abnormalities.  Suspect postradiation pain/indigestion, however unclear definitive etiology.  Seems reasonable for discharge with supportive care and close outpatient follow-up.  Based on patient's chief complaint, I considered admission might be necessary, however after reassuring ED workup feel patient is reasonable for discharge.   I discussed results,  treatment plan, need for PCP follow-up, and return precautions with the patient and his wife @ bedside. Provided opportunity for questions, patient and his wife confirmed understanding and are in agreement with plan.   Discussed with supervising physician Dr. Judd Lien who is in agreement.  Portions of this note were generated with Scientist, clinical (histocompatibility and immunogenetics). Dictation errors may occur despite best attempts at proofreading.  Final Clinical Impression(s) / ED Diagnoses Final diagnoses:  Chest pain, unspecified type    Rx / DC Orders ED Discharge Orders          Ordered    HYDROcodone-acetaminophen (NORCO/VICODIN) 5-325 MG tablet  Every 6 hours PRN        09/06/21 0251    lidocaine (XYLOCAINE) 2 % solution  Every 6 hours PRN        09/06/21 0251    aluminum-magnesium hydroxide 200-200 MG/5ML suspension  Every 6 hours PRN        09/06/21 0251    sucralfate (CARAFATE) 1 GM/10ML suspension  3 times daily with meals & bedtime        09/06/21 0251           [facility] Controlled Substance reporting System queried    Desmond Lope 09/06/21 0251    Geoffery Lyons, MD 09/06/21 [phone]

## 2021-09-08 ENCOUNTER — Telehealth: Payer: Self-pay

## 2021-09-08 NOTE — Telephone Encounter (Signed)
In reference to patient's increasing esophogeal pain. I verified spouse Mrs. Ruffin Pyo identity and notified her of the message below...  Per Shona Simpson PA-C. "His symptoms should improve with time and pain management, and I would hope be resolved in the next 2-3 weeks. Let me know if he'd like to try pain medication. In addition, I would recommend he continue carafate, and can also alternate using OTC liquid antacids like Maalox/Gaviscon/Mylanta type medications."   Spouse has requested a refill on patient's HYDROcodone. She states "It is helping a little."   I have relayed this refill request to Shona Simpson PA-C and told the patient to give [place] a little time to get it sent in. I left my extension [phone]. Spouse verbalized understanding.

## 2021-09-08 NOTE — Telephone Encounter (Signed)
Spouse Mrs. Ruffin Pyo identity verified and she reports that patient is still having severe esophageal pain 8/10 post taking the RX Carafate. The pain is now extending to his ears and jaw bilaterally. I advised the patient to go to the ER immediately if there are any signs of swelling of the throat, causing an inability to swallow or breathe. I told spouse Mrs. Ruffin Pyo that I would give this information to the patient's Rad/Onc care team, Dr. Tammi Klippel and Freeman Caldron PA-C and give patient a call back before the end of the day today 09/08/21. I left my extension [phone] and spouse verbalized understanding of the conversation.

## 2021-09-10 ENCOUNTER — Other Ambulatory Visit: Payer: Self-pay | Admitting: Radiation Oncology

## 2021-09-10 MED ORDER — HYDROCODONE-ACETAMINOPHEN 7.5-325 MG/15ML PO SOLN: 15.0000 mL | Freq: Four times a day (QID) | ORAL | 0 refills | Status: AC | PRN

## 2021-10-12 ENCOUNTER — Encounter: Payer: Self-pay | Admitting: Urology

## 2021-10-12 NOTE — Progress Notes (Signed)
Telephone appointment. I spoke w/ patient's spouse Mrs. Ruffin Pyo, verified her identity and began nursing interview. She reports that patient is having some dorsalgia 4/10. No other issues reported at this time. No other issues reported at this time.  Meaningful use complete. I-PSS score of 5-mild. No urinary management medications. Urology appt- Aug, 2023.  Reminded patient of their 10:00am-10/13/21 telephone appointment w/ Ashlyn Bruning PA-C. I left my extension [phone] in case patient needs anything. Patient verbalized understanding.  Patient contact [phone] or [phone]

## 2021-10-13 ENCOUNTER — Ambulatory Visit
Admission: RE | Admit: 2021-10-13 | Discharge: 2021-10-13 | Disposition: A | Discharge status: Home / Self Care | Payer: Medicare Other | Source: Ambulatory Visit | Attending: Radiation Oncology | Admitting: Radiation Oncology

## 2021-10-13 DIAGNOSIS — C61 Malignant neoplasm of prostate: Secondary | ICD-10-CM | POA: Insufficient documentation

## 2021-10-13 DIAGNOSIS — C771 Secondary and unspecified malignant neoplasm of intrathoracic lymph nodes: Secondary | ICD-10-CM | POA: Insufficient documentation

## 2021-10-13 NOTE — Progress Notes (Signed)
  Radiation Oncology         [phone]) [phone] ________________________________  Name: Brendan Anderson MRN: [id number]  Date: 08/27/2021  DOB: 27-Jul-1955  End of Treatment Note  Diagnosis:    66 y.o. 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    Indication for treatment:  Curative, Definitive SBRT       Radiation treatment dates:   08/16/21 - 08/27/21  Site/dose:   The PET positive intrathoracic lymph node targets were treated to 50 Gy in 5 fractions of 10 Gy  Beams/energy:   The patient was treated using stereotactic body radiotherapy according to a 3D conformal radiotherapy plan.  Volumetric arc fields were employed to deliver 6 MV X-rays.  Image guidance was performed with per fraction cone beam CT prior to treatment under personal MD supervision.  Immobilization was achieved using BodyFix Pillow.  Narrative: The patient tolerated radiation treatment relatively well with only mild fatigue.  Plan: The patient has completed radiation treatment. The patient will return to radiation oncology clinic for routine followup in one month. I advised them to call or return sooner if they have any questions or concerns related to their recovery or treatment. ________________________________  Sheral Apley. Tammi Klippel, M.D.

## 2021-10-13 NOTE — Progress Notes (Signed)
Radiation Oncology         [phone] ________________________________  Name: Brendan Anderson MRN: [id number]  Date: 10/13/2021  DOB: 01/29/1956  Post Treatment Note  CC: Eber Hong, MD  Raynelle Bring, MD  Diagnosis:   66 y.o. 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    Interval Since Last Radiation:  6.5 weeks  08/16/21 - 08/27/21:  The PET positive intrathoracic lymph node targets were treated to 50 Gy in 5 fractions of 10 Gy  Narrative:  I spoke with the patient to conduct his routine scheduled 1 month follow up visit via telephone to spare the patient unnecessary potential exposure in the healthcare setting during the current COVID-19 pandemic.  The patient was notified in advance and gave permission to proceed with this visit format.  He tolerated radiation treatment relatively well with only mild fatigue                              On review of systems, the patient states that he is doing very well in general and is currently without complaints.  Unfortunately, he did experience some moderate to severe esophagitis approximately 1 week after completing radiation.  He was evaluated in the emergency department at [facility] and given some viscous lidocaine and Carafate which provided some relief.  After approximately 3 weeks, it has now fully resolved and he is back to eating and drinking without issue.  He denies any chest pain, productive cough, dysphagia, shortness of breath or hemoptysis.  He reports a healthy appetite and is maintaining his weight now.  His energy level is gradually improving and overall, he is pleased with his progress to date.  ALLERGIES:  is allergic to codeine.  Meds: Current Outpatient Medications  Medication Sig Dispense Refill   Al Hyd-Mg Tr-Alg Ac-Sod Bicarb (GAVISCON-2 PO) Take 2 tablets by mouth at bedtime.      aluminum-magnesium hydroxide 200-200 MG/5ML suspension Take 15 mLs by mouth every 6 (six) hours as needed for indigestion. 355 mL 0   ascorbic acid (VITAMIN C) 250 MG tablet Take 500 mg by mouth daily at 12 noon.     Calcium Carbonate Antacid 400 MG CHEW Chew 800 mg by mouth 2 (two) times daily.     cetirizine (ZYRTEC) 10 MG tablet Take 10 mg by mouth daily as needed for allergies.     denosumab (PROLIA) 60 MG/ML SOSY injection Inject 60 mg into the skin every 6 (six) months.     esomeprazole (NEXIUM) 40 MG capsule Take 40 mg by mouth daily.     lidocaine (XYLOCAINE) 2 % solution Use as directed 15 mLs in the mouth or throat every 6 (six) hours as needed for mouth pain. 100 mL 0   meloxicam (MOBIC) 7.5 MG tablet Take 7.5 mg by mouth 2 (two) times daily.     Multiple Vitamin (MULTIVITAMIN) tablet Take 1 tablet by mouth daily.     Multiple Vitamins-Minerals (MULTIVITAMIN ADULT, MINERALS,) TABS Take 1 tablet by mouth daily.     Multiple Vitamins-Minerals (ZINC PO) Take 1 tablet by mouth every other day.     sildenafil (VIAGRA) 100 MG tablet Take 25 mg by mouth daily as needed for erectile dysfunction.     sucralfate (CARAFATE) 1 GM/10ML suspension Take 10 mLs (1 g total) by mouth 4 (four) times daily -  with meals and at bedtime. 420 mL 0   tizanidine (ZANAFLEX) 2 MG capsule Take 2 mg by mouth daily as needed for muscle spasms.     traZODone (DESYREL) 100 MG tablet Take 100 mg by mouth at bedtime.     Vitamin D, Ergocalciferol, (DRISDOL) 1.25 MG (50000 UNIT) CAPS capsule Take 50,000 Units by mouth once a week. Wednesday     No current facility-administered medications for this encounter.    Physical Findings:  vitals were not taken for this visit.  Pain Assessment Pain Score: 4  (Dorsalgia)/10 Unable to assess due to telephone follow-up visit format.  Lab Findings: Lab Results  Component Value Date   WBC 3.7 (L) 09/05/2021   HGB 14.7 09/05/2021   HCT 41.7 09/05/2021   MCV 86.9 09/05/2021    PLT 124 (L) 09/05/2021     Radiographic Findings: No results found.  Impression/Plan: 1. 66 y.o. 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   He appears to have recovered well from the effects of his recent ultrahypofractionated radiotherapy to the PET positive intrathoracic nodes.  He did have some significant dysphagia following treatment but this has now fully resolved and he is currently without complaints.  He is scheduled for a follow-up PSA test on 10/22/2021 and will see Dr. Alinda Money the following week on 10/29/2021.  We enjoyed taking care of him and look forward to continuing to follow his progress via correspondence.    Nicholos Johns, PA-C

## 2021-11-26 ENCOUNTER — Telehealth: Payer: Self-pay | Admitting: Hematology

## 2021-11-26 NOTE — Telephone Encounter (Signed)
Scheduled appointment per 09/14 referral . Patient is aware of appointment date and time. Patient is aware to arrive 15 mins prior to appointment time and to bring updated insurance cards. Patient is aware of location.

## 2021-12-08 ENCOUNTER — Encounter: Payer: Self-pay | Admitting: Hematology

## 2021-12-08 ENCOUNTER — Inpatient Hospital Stay: Payer: Medicare Other | Attending: Hematology | Admitting: Hematology

## 2021-12-08 VITALS — BP 130/82 | HR 70 | Temp 97.9°F | Resp 18 | Ht 68.0 in | Wt 184.0 lb

## 2021-12-08 DIAGNOSIS — Z923 Personal history of irradiation: Secondary | ICD-10-CM | POA: Diagnosis not present

## 2021-12-08 DIAGNOSIS — C775 Secondary and unspecified malignant neoplasm of intrapelvic lymph nodes: Secondary | ICD-10-CM | POA: Diagnosis not present

## 2021-12-08 DIAGNOSIS — Z87891 Personal history of nicotine dependence: Secondary | ICD-10-CM | POA: Diagnosis not present

## 2021-12-08 DIAGNOSIS — C61 Malignant neoplasm of prostate: Secondary | ICD-10-CM | POA: Diagnosis present

## 2021-12-08 DIAGNOSIS — C771 Secondary and unspecified malignant neoplasm of intrathoracic lymph nodes: Secondary | ICD-10-CM | POA: Insufficient documentation

## 2021-12-08 DIAGNOSIS — M810 Age-related osteoporosis without current pathological fracture: Secondary | ICD-10-CM

## 2021-12-08 NOTE — Patient Instructions (Addendum)
[facility]  Discharge Instructions  You were seen and examined today by Dr. Delton Coombes. Dr. Delton Coombes is a medical oncologist, meaning that he specializes in the treatment of cancer diagnoses. Dr. Delton Coombes discussed your past medical history, family history of cancers, and the events that led to you being here today.  You were referred to Dr. Delton Coombes due to your known diagnosis of Prostate Cancer. Recently, your PSA has been increasing.  You have been diagnosed with Stage IV Prostate Cancer, because it has spread to the lymph nodes in the chest - but there is no indication for treatment at this time.  Dr. Delton Coombes and Dr. Alinda Money are in agreement that close monitoring is the preferred course of treatment.  If the PSA rises quickly or if more areas of concern are noted on his scans, Dr. Delton Coombes will discuss next steps at that time. Dr. Alinda Money can continue to monitor your PSA at this time.  Follow-up as needed.  Thank you for choosing [facility] to provide your oncology and hematology care.   To afford each patient quality time with our provider, please arrive at least 15 minutes before your scheduled appointment time. You may need to reschedule your appointment if you arrive late (10 or more minutes). Arriving late affects you and other patients whose appointments are after yours.  Also, if you miss three or more appointments without notifying the office, you may be dismissed from the clinic at the provider's discretion.    Again, thank you for choosing [facility].  Our hope is that these requests will decrease the amount of time that you wait before being seen by our physicians.   If you have a lab appointment with the [facility] please come in thru the Main Entrance and check in at the main information desk.           _____________________________________________________________  Should you have questions  after your visit to [facility], please contact our office at [phone] and follow the prompts.  Our office hours are 8:00 a.m. to 4:30 p.m. Monday - Thursday and 8:00 a.m. to 2:30 p.m. Friday.  Please note that voicemails left after 4:00 p.m. may not be returned until the following business day.  We are closed weekends and all major holidays.  You do have access to a nurse 24-7, just call the main number to the clinic [phone] and do not press any options, hold on the line and a nurse will answer the phone.    For prescription refill requests, have your pharmacy contact our office and allow 72 hours.    Masks are optional in the cancer centers. If you would like for your care team to wear a mask while they are taking care of you, please let them know. You may have one support person who is at least 68 years old accompany you for your appointments.

## 2021-12-08 NOTE — Progress Notes (Signed)
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 male is seen in consultation today at the request of Dr. Alinda Money for metastatic prostate cancer.  He underwent radical prostatectomy and BPL ND on 06/27/2019.  Pathology showed PT3BN1 Gleason 5+4=9 adenocarcinoma with negative margins, 1/9 lymph nodes positive, pretreatment PSA 6.3.  He had persistently elevated PSA postoperatively and underwent salvage radiation with short-term ADT from September through November 2021.  PSMA PET scan in April 2023 showed mediastinal lymphadenopathy.  He underwent SBRT to the mediastinal lymph nodes and completed in June 2023.  He had recently seen Dr. Alinda Money and PSA level was 2.26.  He reportedly had dysphagia and odynophagia after radiation which took about couple of months to recover.  He denies any new onset pains.  He is seen with his wife today.  MEDICAL HISTORY:  Past Medical History:  Diagnosis Date   Anxiety    Arthritis    hip knees wrist   Depression    Family history of breast cancer    Family history of prostate cancer    GERD (gastroesophageal reflux disease)    Osteoporosis    Prostate cancer ([place])    Skin cancer     SURGICAL HISTORY: Past Surgical History:  Procedure Laterality Date   BICEPS TENDON REPAIR Left 07/2016   EYE SURGERY Bilateral 2005   LYMPHADENECTOMY Bilateral 06/27/2019   Procedure: LYMPHADENECTOMY, PELVIC;  Surgeon: Raynelle Bring, MD;  Location: [facility];  Service: Urology;  Laterality: Bilateral;   MOLE REMOVAL  2008   pre CA from face    NASAL SINUS SURGERY  2010   ROBOT ASSISTED LAPAROSCOPIC RADICAL PROSTATECTOMY N/A 06/27/2019   Procedure: XI ROBOTIC ASSISTED LAPAROSCOPIC RADICAL PROSTATECTOMY LEVEL 2;  Surgeon: Raynelle Bring, MD;  Location: [facility];  Service: Urology;  Laterality: N/A;   SHOULDER ARTHROSCOPY WITH ROTATOR CUFF REPAIR Left 11/2016    SOCIAL HISTORY: Social History   Socioeconomic History   Marital status: Married    Spouse name: Horris Latino   Number of children: 2   Years of education: Not on file   Highest education level: Not on file  Occupational History   Occupation: retired    Comment: Civil engineer, contracting  Tobacco Use   Smoking status: Former    Packs/day: 0.25    Years: 10.00    Total pack years: 2.50    Types: Cigars, Cigarettes    Quit date: 06/19/1983    Years since quitting: 38.4   Smokeless tobacco: Never  Vaping Use   Vaping Use: Never used  Substance and Sexual Activity   Alcohol use: Yes    Comment: rare   Drug use: Never   Sexual activity: Not Currently  Other Topics Concern   Not on file  Social History Narrative   Not on file   Social Determinants of Health   Financial Resource Strain: Not on file  Food Insecurity: Not on file  Transportation Needs: Not on file  Physical Activity: Not on file  Stress: Not on file  Social Connections: Not on file  Intimate Partner Violence: Not on file    FAMILY HISTORY: Family History  Problem  Relation Age of Onset   Leukemia Mother    Breast cancer Mother 33   Cancer Father        NOS   Breast cancer Paternal Aunt        dx in her 31s   Brain cancer Paternal Uncle        d. 35s   Heart attack Maternal Grandmother    Stroke Maternal Grandfather    Asthma Paternal Grandmother    Prostate cancer Paternal Grandfather    Cancer Nephew 36       brother's grandson - NOS   Colon cancer Neg Hx    Pancreatic cancer Neg Hx     ALLERGIES:  is allergic to codeine.  MEDICATIONS:  Current Outpatient Medications   Medication Sig Dispense Refill   Al Hyd-Mg Tr-Alg Ac-Sod Bicarb (GAVISCON-2 PO) Take 2 tablets by mouth at bedtime.     ascorbic acid (VITAMIN C) 250 MG tablet Take 500 mg by mouth daily at 12 noon.     Calcium Carbonate Antacid 400 MG CHEW Chew 800 mg by mouth 2 (two) times daily.     cetirizine (ZYRTEC) 10 MG tablet Take 10 mg by mouth daily as needed for allergies.     denosumab (PROLIA) 60 MG/ML SOSY injection Inject 60 mg into the skin every 6 (six) months.     esomeprazole (NEXIUM) 40 MG capsule Take 40 mg by mouth daily.     meloxicam (MOBIC) 7.5 MG tablet Take 7.5 mg by mouth 2 (two) times daily.     Multiple Vitamin (MULTIVITAMIN) tablet Take 1 tablet by mouth daily.     Multiple Vitamins-Minerals (MULTIVITAMIN ADULT, MINERALS,) TABS Take 1 tablet by mouth daily.     Multiple Vitamins-Minerals (ZINC PO) Take 1 tablet by mouth every other day.     sildenafil (VIAGRA) 100 MG tablet Take 25 mg by mouth daily as needed for erectile dysfunction.     tizanidine (ZANAFLEX) 2 MG capsule Take 2 mg by mouth daily as needed for muscle spasms.     traZODone (DESYREL) 100 MG tablet Take 100 mg by mouth at bedtime.     Vitamin D, Ergocalciferol, (DRISDOL) 1.25 MG (50000 UNIT) CAPS capsule Take 50,000 Units by mouth once a week. Wednesday     ciprofloxacin (CILOXAN) 0.3 % ophthalmic solution Place 1 drop into the right eye every 4 (four) hours while awake.     No current facility-administered medications for this visit.    REVIEW OF SYSTEMS:   Constitutional: Denies fevers, chills or abnormal night sweats Eyes: Denies blurriness of vision, double vision or watery eyes Ears, nose, mouth, throat, and face: Denies mucositis or sore throat Respiratory: Denies cough, dyspnea or wheezes Cardiovascular: Denies palpitation, chest discomfort or lower extremity swelling Gastrointestinal: Positive for intermittent constipation. Skin: Denies abnormal skin rashes Lymphatics: Denies new lymphadenopathy or  easy bruising Neurological:Denies numbness, tingling or new weaknesses Behavioral/Psych: Mood is stable, no new changes  All other systems were reviewed with the patient and are negative.  PHYSICAL EXAMINATION: ECOG PERFORMANCE STATUS: 1 - Symptomatic but completely ambulatory  Vitals:   12/08/21 1334  BP: 130/82  Pulse: 70  Resp: 18  Temp: 97.9 F (36.6 C)  SpO2: 97%   Filed Weights   12/08/21 1334  Weight: 184 lb (83.5 kg)    GENERAL:alert, no distress and comfortable SKIN: skin color, texture, turgor are normal, no rashes or significant lesions EYES: normal, conjunctiva are pink and non-injected, sclera clear OROPHARYNX:no exudate, no erythema and  lips, buccal mucosa, and tongue normal  NECK: supple, thyroid normal size, non-tender, without nodularity LYMPH:  no palpable lymphadenopathy in the cervical, axillary or inguinal LUNGS: clear to auscultation and percussion with normal breathing effort HEART: regular rate & rhythm and no murmurs and no lower extremity edema ABDOMEN:abdomen soft, non-tender and normal bowel sounds Musculoskeletal:no cyanosis of digits and no clubbing  PSYCH: alert & oriented x 3 with fluent speech NEURO: no focal motor/sensory deficits  LABORATORY DATA:  I have reviewed the data as listed Lab Results  Component Value Date   WBC 3.7 (L) 09/05/2021   HGB 14.7 09/05/2021   HCT 41.7 09/05/2021   MCV 86.9 09/05/2021   PLT 124 (L) 09/05/2021     Chemistry      Component Value Date/Time   NA 138 09/05/2021 2145   K 3.9 09/05/2021 2145   CL 104 09/05/2021 2145   CO2 24 09/05/2021 2145   BUN 19 09/05/2021 2145   CREATININE 1.22 09/05/2021 2145      Component Value Date/Time   CALCIUM 8.9 09/05/2021 2145   ALKPHOS 53 09/05/2021 2345   AST 14 (L) 09/05/2021 2345   ALT 15 09/05/2021 2345   BILITOT 0.8 09/05/2021 2345       RADIOGRAPHIC STUDIES: I have personally reviewed the radiological images as listed and agreed with the findings  in the report. No results found.  ASSESSMENT:  1.  Stage IV (pT3b pN1 M1) prostate cancer: - Prostate biopsy (04/24/2019): Prostatic adenocarcinoma, Gleason's 4+5=9, done in [place] by Dr. Lerry Liner.  PSA was 6.3. - Radical prostatectomy and lymph node resection (06/27/2019) - Pathology: Prostatic adenocarcinoma, Gleason 5+4=9 (grade group 5).  Extraprostatic extension is present at the right posterior mid, bladder neck and bilateral posterior base, bilateral seminal vesicles invasion present (pT3b).  LVI present.  Margins negative.  Metastatic adenocarcinoma in 1/4 lymph nodes in the right pelvis.  0/5 lymph nodes in the left pelvis.  PT3BPN1. - Postsurgery PSA of 0.2.  10/29/2019 PSA 0.38. - XRT to prostate fossa, pelvic lymph nodes from 12/02/2019 - 01/23/2020 with short-term ADT - PSA initially became undetectable, but quickly began increasing and was noted to have biochemical recurrence in November 2022, PSA 0.46.  PSA increased to 2.04 in February 2023. - PSMA PET scan (07/05/2021): 2 very small presumed foci of nodal tissue in the left middle mediastinum along the descending thoracic aorta concerning for nodal prostate cancer metastasis.  No evidence of local recurrence in the prostatectomy bed or nodal metastasis in the pelvis.  No evidence of visceral or skeletal metastasis. - Germline mutation testing ([facility]) negative on 12/11/2019 - SBRT to the PET positive intrathoracic lymph nodes, 50 Gray in 5 fractions from 08/16/2021 through 08/27/2021. - PSA: 2.26 (10/22/2021), 2.04 (06/08/2021), 0.46 (02/09/2021), 0.17 (11/05/2020), <0.015 (04/22/2020), 0.38 (10/29/2019), 0.20 (09/19/2019). - Total testosterone 427 (11/04/2020)  2.  Social/family history: - He is married and lives with his wife.  He worked with heating and air conditioning.  He had very little exposure to asbestos.  No other chemical exposure. - He quit smoking in 1985. - Mother had breast cancer in her early 34s and died with acute leukemia  few months after receiving chemotherapy for breast cancer.  PLAN:  1.  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 - I agree with current plan of monitoring PSA closely.  He had PSA checked on 11/02/2021 in [place]  by Dr. Brynda Greathouse, was 2.04. - We have reviewed systemic therapy options if he progresses.  Dr. Alinda Money is planning to enroll him in ARSTEP trial if his PSA doubling time becomes more concerning. - He has an appointment to see Dr. Alinda Money with repeat PSA in November. - I plan to see him back in 6 months for follow-up.  2.  Osteoporosis: - He follows up with Dr. Megan Salon in [place] and is on Prolia injections every 6 months which started about a year ago. - Continue calcium and vitamin D supplements.   Orders Placed This Encounter  Procedures   PSA    Standing Status:   Future    Standing Expiration Date:   12/08/2022    Order Specific Question:   Release to patient    Answer:   Immediate    Order Specific Question:   Remote health to draw?    Answer:   No    All questions were answered. The patient knows to call the clinic with any problems, questions or concerns.     Derek Jack, MD 12/08/2021 5:51 PM

## 2022-06-08 ENCOUNTER — Inpatient Hospital Stay (HOSPITAL_BASED_OUTPATIENT_CLINIC_OR_DEPARTMENT_OTHER): Payer: Medicare Other | Admitting: Hematology

## 2022-06-08 ENCOUNTER — Inpatient Hospital Stay: Payer: Medicare Other | Attending: Hematology

## 2022-06-08 VITALS — BP 112/73 | HR 84 | Temp 98.9°F | Resp 18 | Ht 68.0 in | Wt 190.3 lb

## 2022-06-08 DIAGNOSIS — Z9079 Acquired absence of other genital organ(s): Secondary | ICD-10-CM | POA: Insufficient documentation

## 2022-06-08 DIAGNOSIS — C775 Secondary and unspecified malignant neoplasm of intrapelvic lymph nodes: Secondary | ICD-10-CM

## 2022-06-08 DIAGNOSIS — C61 Malignant neoplasm of prostate: Secondary | ICD-10-CM | POA: Diagnosis present

## 2022-06-08 DIAGNOSIS — M810 Age-related osteoporosis without current pathological fracture: Secondary | ICD-10-CM | POA: Diagnosis not present

## 2022-06-08 DIAGNOSIS — Z87891 Personal history of nicotine dependence: Secondary | ICD-10-CM | POA: Insufficient documentation

## 2022-06-08 DIAGNOSIS — Z923 Personal history of irradiation: Secondary | ICD-10-CM | POA: Diagnosis not present

## 2022-06-08 LAB — PSA: Prostatic Specific Antigen: 1.96 ng/mL (ref 0.00–4.00)

## 2022-06-08 NOTE — Progress Notes (Signed)
[facility] [address] [place] [address]    Clinic Day:  06/08/2022  Referring physician: Eber Hong, MD  Patient Care Team: Eber Hong, MD as PCP - General (Internal Medicine) Cira Rue, RN as Registered Nurse (Medical Oncology) Derek Jack, MD as Medical Oncologist (Medical Oncology) Brien Mates, RN as Oncology Nurse Navigator (Medical Oncology)   ASSESSMENT & PLAN:   Assessment: 1.  Stage IV (pT3b pN1 M1) prostate cancer: - Prostate biopsy (04/24/2019): Prostatic adenocarcinoma, Gleason's 4+5=9, done in [place] by Dr. Lerry Liner.  PSA was 6.3. - Radical prostatectomy and lymph node resection (06/27/2019) - Pathology: Prostatic adenocarcinoma, Gleason 5+4=9 (grade group 5).  Extraprostatic extension is present at the right posterior mid, bladder neck and bilateral posterior base, bilateral seminal vesicles invasion present (pT3b).  LVI present.  Margins negative.  Metastatic adenocarcinoma in 1/4 lymph nodes in the right pelvis.  0/5 lymph nodes in the left pelvis.  PT3BPN1. - Postsurgery PSA of 0.2.  10/29/2019 PSA 0.38. - XRT to prostate fossa, pelvic lymph nodes from 12/02/2019 - 01/23/2020 with short-term ADT - PSA initially became undetectable, but quickly began increasing and was noted to have biochemical recurrence in November 2022, PSA 0.46.  PSA increased to 2.04 in February 2023. - PSMA PET scan (07/05/2021): 2 very small presumed foci of nodal tissue in the left middle mediastinum along the descending thoracic aorta concerning for nodal prostate cancer metastasis.  No evidence of local recurrence in the prostatectomy bed or nodal metastasis in the pelvis.  No evidence of visceral or skeletal metastasis. - Germline mutation testing ([facility]) negative on 12/11/2019 - SBRT to the PET positive intrathoracic lymph nodes, 50 Gray in 5 fractions from 08/16/2021 through 08/27/2021. - PSA: 2.26 (10/22/2021), 2.04 (06/08/2021), 0.46 (02/09/2021), 0.17  (11/05/2020), <0.015 (04/22/2020), 0.38 (10/29/2019), 0.20 (09/19/2019). - Total testosterone 427 (11/04/2020)   2.  Social/family history: - He is married and lives with his wife.  He worked with heating and air conditioning.  He had very little exposure to asbestos.  No other chemical exposure. - He quit smoking in 1985. - Mother had breast cancer in her early 53s and died with acute leukemia few months after receiving chemotherapy for breast cancer.  Plan: 1.  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 Alinda Money. - He also reports tiredness since he received radiation therapy to the prostate in 2021.  He also had COVID in February 2024. - I will obtain records from Dr. Lynne Logan office. - We will follow-up on PSA from today. - He has an appointment with Dr. Sherilyn Banker in May.  If the PSA continues to go high, he will likely have a PET scan done. - I have tentatively scheduled follow-up visit in 6 months.   2.  Osteoporosis: - Continue Prolia injections every 6 months with Dr. Megan Salon and then will. - Continue calcium and vitamin D supplements.  Orders Placed This Encounter  Procedures  . PSA    Standing Status:   Future    Standing Expiration Date:   06/08/2023  . Testosterone    Standing Status:   Future    Standing Expiration Date:   06/08/2023      I,Alexis Herring,acting as a scribe for Derek Jack, MD.,have documented all relevant documentation on the behalf of Derek Jack, MD,as directed by  Derek Jack, MD while in the presence of Derek Jack, MD.   I, Derek Jack  MD, have reviewed the above documentation for accuracy and completeness, and I agree with the above.   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   Prior Therapy:  Radical prostatectomy and lymph node resection (06/27/2019) XRT to prostate fossa, pelvic lymph nodes from 12/02/2019 - 01/23/2020 with short-term ADT SBRT to the PET positive intrathoracic lymph nodes, 50 Gray in 5 fractions from 08/16/2021 through 08/27/2021  Current Therapy:  surveillance  HISTORY OF PRESENT ILLNESS:   Oncology History  Prostate cancer metastatic to intrapelvic lymph node ([place])  08/07/2018 Initial Diagnosis   Prostate cancer metastatic to intrapelvic lymph node ([place])   12/19/2019 Genetic Testing   Negative genetic testing on the multicancer panel.  The Multi-Gene Panel offered by [facility] includes sequencing and/or deletion duplication testing of the following 85 genes: AIP, ALK, APC, ATM, AXIN2,BAP1,  BARD1, BLM, BMPR1A, BRCA1, BRCA2, BRIP1, CASR, CDC73, CDH1, CDK4, CDKN1B, CDKN1C, CDKN2A (p14ARF), CDKN2A (p16INK4a), CEBPA, CHEK2, CTNNA1, DICER1, DIS3L2, EGFR (c.2369C>T, p.Thr790Met variant only), EPCAM (Deletion/duplication testing only), FH, FLCN, GATA2, GPC3, GREM1 (Promoter region deletion/duplication testing only), HOXB13 (c.251G>A, p.Gly84Glu), HRAS, KIT, MAX, MEN1, MET, MITF (c.952G>A, p.Glu318Lys variant only), MLH1, MSH2, MSH3, MSH6, MUTYH, NBN, NF1, NF2, NTHL1, PALB2, PDGFRA, PHOX2B, PMS2, POLD1, POLE, POT1, PRKAR1A, PTCH1, PTEN, RAD50, RAD51C, RAD51D, RB1, RECQL4, RET, RNF43, RUNX1, SDHAF2, SDHA (sequence changes only), SDHB, SDHC, SDHD, SMAD4, SMARCA4, SMARCB1, SMARCE1, STK11, SUFU, TERC, TERT, TMEM127, TP53, TSC1, TSC2, VHL, WRN and WT1.  The report date is December 19, 2019.      INTERVAL HISTORY:   Brendan Anderson is a 67 y.o. 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 He was last seen by me on 12/08/21.  Today, he states that he is doing well overall. His appetite level is at 100%.  His energy level is at 50%. Patient states that  he gets tired easily. He reports that he has had some mild generalized weakness ever since he started radiation in 2021 but does not feel that this has worsened. He states that he had COVID early February 2024. He completed radiation in June 2023. His PSA improved to 2.04 on 11/02/21. He states that his last PSA was checked in 04/2022 and was around 2.1. He noted that he has follow up with Dr. Alinda Money again in May. Patient states that Dr. Alinda Money did not put him on any trial therapy. He states that Dr. Alinda Money plans to repeat a PET scan in the near future. He denies any new pains. He receives Prolia as managed by his orthopedist and continues to take Ergocalciferol 50K IU weekly and calcium 800mg  BID.  PAST MEDICAL HISTORY:   Past Medical History: Past Medical History:  Diagnosis Date  . Anxiety   . Arthritis    hip knees wrist  . Depression   . Family history of breast cancer   . Family history of prostate cancer   . GERD (gastroesophageal reflux disease)   . Osteoporosis   . Prostate cancer ([place])   . Skin cancer     Surgical History: Past Surgical History:  Procedure Laterality Date  . BICEPS TENDON REPAIR Left 07/2016  . EYE SURGERY Bilateral 2005  . LYMPHADENECTOMY Bilateral 06/27/2019   Procedure: LYMPHADENECTOMY, PELVIC;  Surgeon: Raynelle Bring, MD;  Location: [facility];  Service: Urology;  Laterality: Bilateral;  . MOLE REMOVAL  2008   pre CA from face  . NASAL SINUS SURGERY  2010  . ROBOT ASSISTED LAPAROSCOPIC RADICAL PROSTATECTOMY N/A 06/27/2019   Procedure: XI ROBOTIC ASSISTED LAPAROSCOPIC RADICAL PROSTATECTOMY LEVEL 2;  Surgeon: Raynelle Bring, MD;  Location: [facility];  Service: Urology;  Laterality: N/A;  . SHOULDER ARTHROSCOPY WITH ROTATOR CUFF REPAIR Left 11/2016    Social History: Social History   Socioeconomic History  . Marital status: Married    Spouse name: Horris Latino  . Number of children: 2  . Years of education: Not on file  . Highest education level: Not on file   Occupational History  . Occupation: retired    Comment: Civil engineer, contracting  Tobacco Use  . Smoking status: Former    Packs/day: 0.25    Years: 10.00    Additional pack years: 0.00    Total pack years: 2.50    Types: Cigars, Cigarettes    Quit date: 06/19/1983    Years since quitting: 38.9  . Smokeless tobacco: Never  Vaping Use  . Vaping Use: Never used  Substance and Sexual Activity  . Alcohol use: Yes    Comment: rare  . Drug use: Never  . Sexual activity: Not Currently  Other Topics Concern  . Not on file  Social History Narrative  . Not on file   Social Determinants of Health   Financial Resource Strain: Not on file  Food Insecurity: Not on file  Transportation Needs: Not on file  Physical Activity: Not on file  Stress: Not on file  Social Connections: Not on file  Intimate Partner Violence: Not on file    Family History: Family History  Problem Relation Age of Onset  . Leukemia Mother   . Breast cancer Mother 72  . Cancer Father        NOS  . Breast cancer Paternal Aunt        dx in her 32s  . Brain cancer Paternal Uncle        d. 24s  . Heart attack Maternal Grandmother   . Stroke Maternal Grandfather   . Asthma Paternal Grandmother   . Prostate cancer Paternal Grandfather   . Cancer Nephew 10       brother's grandson - NOS  . Colon cancer Neg Hx   . Pancreatic cancer Neg Hx     Current Medications:  Current Outpatient Medications:  .  Al Hyd-Mg Tr-Alg Ac-Sod Bicarb (GAVISCON-2 PO), Take 2 tablets by mouth at bedtime., Disp: , Rfl:  .  ascorbic acid (VITAMIN C) 250 MG tablet, Take 500 mg by mouth daily at 12 noon., Disp: , Rfl:  .  Calcium Carbonate Antacid 400 MG CHEW, Chew 800 mg by mouth 2 (two) times daily., Disp: , Rfl:  .  celecoxib (CELEBREX) 200 MG capsule, Take 200 mg by mouth daily., Disp: , Rfl:  .  cetirizine (ZYRTEC) 10 MG tablet, Take 10 mg by mouth daily as needed for allergies., Disp: , Rfl:  .  ciprofloxacin (CILOXAN)  0.3 % ophthalmic solution, Place 1 drop into the right eye every 4 (four) hours while awake., Disp: , Rfl:  .  denosumab (PROLIA) 60 MG/ML SOSY injection, Inject 60 mg into the skin every 6 (six) months., Disp: , Rfl:  .  esomeprazole (NEXIUM) 40 MG capsule, Take 40 mg by mouth daily., Disp: , Rfl:  .  Multiple Vitamin (MULTIVITAMIN) tablet, Take 1 tablet by mouth daily., Disp: ,  Rfl:  .  Multiple Vitamins-Minerals (MULTIVITAMIN ADULT, MINERALS,) TABS, Take 1 tablet by mouth daily., Disp: , Rfl:  .  Multiple Vitamins-Minerals (ZINC PO), Take 1 tablet by mouth every other day., Disp: , Rfl:  .  sildenafil (VIAGRA) 100 MG tablet, Take 25 mg by mouth daily as needed for erectile dysfunction., Disp: , Rfl:  .  tizanidine (ZANAFLEX) 2 MG capsule, Take 2 mg by mouth daily as needed for muscle spasms., Disp: , Rfl:  .  traZODone (DESYREL) 100 MG tablet, Take 100 mg by mouth at bedtime., Disp: , Rfl:  .  Vitamin D, Ergocalciferol, (DRISDOL) 1.25 MG (50000 UNIT) CAPS capsule, Take 50,000 Units by mouth once a week. Wednesday, Disp: , Rfl:    Allergies: Allergies  Allergen Reactions  . Codeine Other (See Comments)    "MAKES HIM HYPER"    REVIEW OF SYSTEMS:   Review of Systems  Constitutional:  Positive for fatigue. Negative for appetite change, chills and fever.  HENT:   Negative for lump/mass, mouth sores, nosebleeds, sore throat and trouble swallowing.   Eyes:  Negative for eye problems.  Respiratory:  Negative for cough and shortness of breath.   Cardiovascular:  Negative for chest pain, leg swelling and palpitations.  Gastrointestinal:  Negative for abdominal pain, constipation, diarrhea, nausea and vomiting.  Genitourinary:  Positive for frequency. Negative for bladder incontinence, difficulty urinating, dysuria, hematuria and nocturia.   Musculoskeletal:  Positive for back pain (4/10 in severity). Negative for arthralgias, flank pain, myalgias and neck pain.  Skin:  Negative for itching and  rash.  Neurological:  Negative for dizziness, headaches and numbness.  Hematological:  Does not bruise/bleed easily.  Psychiatric/Behavioral:  Negative for depression, sleep disturbance and suicidal ideas. The patient is not nervous/anxious.   All other systems reviewed and are negative.    VITALS:   Blood pressure 112/73, pulse 84, temperature 98.9 F (37.2 C), temperature source Oral, resp. rate 18, height 5\' 8"  (1.727 m), weight 190 lb 4.8 oz (86.3 kg), SpO2 98 %.  Wt Readings from Last 3 Encounters:  06/08/22 190 lb 4.8 oz (86.3 kg)  12/08/21 184 lb (83.5 kg)  09/05/21 174 lb (78.9 kg)    Body mass index is 28.94 kg/m.  Performance status (ECOG): 1 - Symptomatic but completely ambulatory  PHYSICAL EXAM:   Physical Exam Vitals and nursing note reviewed. Exam conducted with a chaperone present.  Constitutional:      Appearance: Normal appearance.  Cardiovascular:     Rate and Rhythm: Normal rate and regular rhythm.     Pulses: Normal pulses.     Heart sounds: Normal heart sounds.  Pulmonary:     Effort: Pulmonary effort is normal.     Breath sounds: Normal breath sounds.  Abdominal:     Palpations: Abdomen is soft. There is no hepatomegaly, splenomegaly or mass.     Tenderness: There is no abdominal tenderness.  Musculoskeletal:     Right lower leg: No edema.     Left lower leg: No edema.  Lymphadenopathy:     Cervical: No cervical adenopathy.     Right cervical: No superficial, deep or posterior cervical adenopathy.    Left cervical: No superficial, deep or posterior cervical adenopathy.     Upper Body:     Right upper body: No supraclavicular or axillary adenopathy.     Left upper body: No supraclavicular or axillary adenopathy.  Neurological:     General: No focal deficit present.     Mental  Status: He is alert and oriented to person, place, and time.  Psychiatric:        Mood and Affect: Mood normal.        Behavior: Behavior normal.    LABS:       Latest Ref Rng & Units 09/05/2021    9:45 PM 06/28/2019   10:49 AM 06/28/2019    4:56 AM  CBC  WBC 4.0 - 10.5 K/uL 3.7     Hemoglobin 13.0 - 17.0 g/dL 14.7  11.1  11.3   Hematocrit 39.0 - 52.0 % 41.7  33.8  34.1   Platelets 150 - 400 K/uL 124         Latest Ref Rng & Units 09/05/2021   11:45 PM 09/05/2021    9:45 PM 06/19/2019    2:08 PM  CMP  Glucose 70 - 99 mg/dL  134  116   BUN 8 - 23 mg/dL  19  20   Creatinine 0.61 - 1.24 mg/dL  1.22  1.21   Sodium 135 - 145 mmol/L  138  141   Potassium 3.5 - 5.1 mmol/L  3.9  4.1   Chloride 98 - 111 mmol/L  104  106   CO2 22 - 32 mmol/L  24  26   Calcium 8.9 - 10.3 mg/dL  8.9  9.1   Total Protein 6.5 - 8.1 g/dL 6.9     Total Bilirubin 0.3 - 1.2 mg/dL 0.8     Alkaline Phos 38 - 126 U/L 53     AST 15 - 41 U/L 14     ALT 0 - 44 U/L 15        No results found for: "CEA1", "CEA" / No results found for: "CEA1", "CEA" No results found for: "PSA1" No results found for: "[id number]" No results found for: "CAN125"  No results found for: "TOTALPROTELP", "ALBUMINELP", "A1GS", "A2GS", "BETS", "BETA2SER", "GAMS", "MSPIKE", "SPEI" No results found for: "TIBC", "FERRITIN", "IRONPCTSAT" No results found for: "LDH"   STUDIES:   No results found.

## 2022-06-08 NOTE — Patient Instructions (Signed)
[facility]  Discharge Instructions  You were seen and examined today by Dr. Delton Coombes.  Dr. Delton Coombes is going to request records from Dr. Lynne Logan office.  Follow-up as scheduled in 6 months.    Thank you for choosing [facility] to provide your oncology and hematology care.   To afford each patient quality time with our provider, please arrive at least 15 minutes before your scheduled appointment time. You may need to reschedule your appointment if you arrive late (10 or more minutes). Arriving late affects you and other patients whose appointments are after yours.  Also, if you miss three or more appointments without notifying the office, you may be dismissed from the clinic at the provider's discretion.    Again, thank you for choosing [facility].  Our hope is that these requests will decrease the amount of time that you wait before being seen by our physicians.   If you have a lab appointment with the [facility] please come in thru the Main Entrance and check in at the main information desk.           _____________________________________________________________  Should you have questions after your visit to [facility], please contact our office at [phone] and follow the prompts.  Our office hours are 8:00 a.m. to 4:30 p.m. Monday - Thursday and 8:00 a.m. to 2:30 p.m. Friday.  Please note that voicemails left after 4:00 p.m. may not be returned until the following business day.  We are closed weekends and all major holidays.  You do have access to a nurse 24-7, just call the main number to the clinic [phone] and do not press any options, hold on the line and a nurse will answer the phone.    For prescription refill requests, have your pharmacy contact our office and allow 72 hours.    Masks are optional in the cancer centers. If you would like for your care team to wear a mask while  they are taking care of you, please let them know. You may have one support person who is at least 69 years old accompany you for your appointments.

## 2022-09-29 ENCOUNTER — Other Ambulatory Visit (HOSPITAL_COMMUNITY): Payer: Self-pay | Admitting: Urology

## 2022-09-29 DIAGNOSIS — C61 Malignant neoplasm of prostate: Secondary | ICD-10-CM

## 2022-09-29 DIAGNOSIS — R9721 Rising PSA following treatment for malignant neoplasm of prostate: Secondary | ICD-10-CM

## 2022-10-17 ENCOUNTER — Encounter (HOSPITAL_COMMUNITY)
Admission: RE | Admit: 2022-10-17 | Discharge: 2022-10-17 | Disposition: A | Discharge status: Home / Self Care | Payer: Medicare Other | Source: Ambulatory Visit | Attending: Urology | Admitting: Urology

## 2022-10-17 DIAGNOSIS — R9721 Rising PSA following treatment for malignant neoplasm of prostate: Secondary | ICD-10-CM | POA: Insufficient documentation

## 2022-10-17 DIAGNOSIS — C61 Malignant neoplasm of prostate: Secondary | ICD-10-CM | POA: Insufficient documentation

## 2022-10-17 MED ORDER — PIFLIFOLASTAT F 18 (PYLARIFY) INJECTION
9.0000 | Freq: Once | INTRAVENOUS | Status: AC
  Administered 2022-10-17: 8.368 via INTRAVENOUS

## 2022-11-03 ENCOUNTER — Other Ambulatory Visit: Payer: Self-pay

## 2022-11-03 ENCOUNTER — Ambulatory Visit
Admission: RE | Admit: 2022-11-03 | Discharge: 2022-11-03 | Disposition: A | Discharge status: Home / Self Care | Payer: Medicare Other | Source: Ambulatory Visit | Attending: Radiation Oncology | Admitting: Radiation Oncology

## 2022-11-03 ENCOUNTER — Encounter: Payer: Self-pay | Admitting: Urology

## 2022-11-03 ENCOUNTER — Ambulatory Visit
Admission: RE | Admit: 2022-11-03 | Discharge: 2022-11-03 | Disposition: A | Discharge status: Home / Self Care | Payer: Medicare Other | Source: Ambulatory Visit | Attending: Urology | Admitting: Urology

## 2022-11-03 VITALS — BP 122/83 | HR 76 | Temp 97.3°F | Resp 18 | Ht 68.0 in | Wt 189.1 lb

## 2022-11-03 DIAGNOSIS — Z803 Family history of malignant neoplasm of breast: Secondary | ICD-10-CM | POA: Diagnosis not present

## 2022-11-03 DIAGNOSIS — Z806 Family history of leukemia: Secondary | ICD-10-CM | POA: Insufficient documentation

## 2022-11-03 DIAGNOSIS — M810 Age-related osteoporosis without current pathological fracture: Secondary | ICD-10-CM | POA: Diagnosis not present

## 2022-11-03 DIAGNOSIS — C7951 Secondary malignant neoplasm of bone: Secondary | ICD-10-CM | POA: Insufficient documentation

## 2022-11-03 DIAGNOSIS — K219 Gastro-esophageal reflux disease without esophagitis: Secondary | ICD-10-CM | POA: Insufficient documentation

## 2022-11-03 DIAGNOSIS — Z8042 Family history of malignant neoplasm of prostate: Secondary | ICD-10-CM | POA: Diagnosis not present

## 2022-11-03 DIAGNOSIS — Z87891 Personal history of nicotine dependence: Secondary | ICD-10-CM | POA: Diagnosis not present

## 2022-11-03 DIAGNOSIS — C61 Malignant neoplasm of prostate: Secondary | ICD-10-CM | POA: Insufficient documentation

## 2022-11-03 DIAGNOSIS — Z79899 Other long term (current) drug therapy: Secondary | ICD-10-CM | POA: Insufficient documentation

## 2022-11-03 DIAGNOSIS — M129 Arthropathy, unspecified: Secondary | ICD-10-CM | POA: Insufficient documentation

## 2022-11-03 NOTE — Progress Notes (Signed)
Radiation Oncology         [phone] ________________________________  Follow Up New  Name: Brendan Anderson MRN: [id number]  Date: 11/03/2022  DOB: 1956-02-28  XB:JYNWG, Renae Fickle, MD  Heloise Purpura, MD   REFERRING PHYSICIAN: Heloise Purpura, MD  DIAGNOSIS: 67 y.o. 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 Malignant neoplasm of prostate metastatic to bone [facility])  C61    C79.51        HISTORY OF PRESENT ILLNESS::Brendan Anderson is a 67 y.o. gentleman.  He was initially diagnosed with Gleason 4+5 adenocarcinoma of the prostate on biopsy with Dr. Ian Bushman 04/23/19, in [place], [place], with a PSA of 6.3 at diagnosis.   He was referred to Dr. Laverle Patter at [facility] in consideration for robotic prostatectomy.  He underwent staging scans here in [place] on 06/03/2019 with CT A/P and bone scan both negative for evidence of visceral or osseous metastatic disease.  He elected to proceed with RALP with BPLND on 06/27/2019 under the care of Dr. Laverle Patter.  Final surgical pathology revealed pT3bN1, Gleason 5+4,  prostatic adenocarcinoma with extraprostatic extension present at right posterior mid, bladder neck, and bilateral posterior base as well as bilateral seminal vesicle invasion and lymphovascular invasion present.  Surgical margins were negative but one out of nine (four right and five left) sampled pelvic lymph nodes were positive for metastatic adenocarcinoma, measuring 1.0 cm with extranodal extension (1/9).  Unfortunately, his postoperative PSA on 09/19/2019 was detectable at 0.2 and increased further to 0.38 on 10/29/2019. He was seen in our multidisciplinary prostate cancer clinic on 11/12/2019 with consensus recommendation for 7.5 weeks of salvage radiation therapy in combination with short-term ADT. He received radiation 12/02/19 - 01/23/20, concurrent with ADT.  He responded well with undetectable PSA in 04/2020 but the PSA  subsequently began to rise again at 0.17 in 10/2020 and 0.46 in 01/2021. His PSA reached 2.04 on 06/08/21, prompting PSMA PET scan, performed on 07/05/21, showing no evidence of local recurrence in the prostatic fossa or pelvic lymph nodes but there were two very small, tracer avid foci of nodal tissue in the left mid mediastinum, along the descending thoracic aorta. No evidence of visceral or skeletal metastasis. He was referred back to [place] on 07/30/21 to discuss treatment options and elected to proceed with metastasis directed therapy with SBRT to the involved mediastinal lymph nodes which he tolerated well.  His PSA nadired at 1.53 in 01/31/2022 but began rising again, up to 6.96 on his most recent PSA from 10/17/2022.  A disease restaging PSMA PET scan was performed 10/17/2022 and showed 2 new skeletal metastases involving the transverse process at T11 and the L1 vertebral body but no lymphadenopathy or visceral disease.  The previously treated mediastinal nodes were no longer PET avid, indicating an excellent response.  The patient has reviewed his recent PSMA PET scan results with his urologist and has been kindly referred back to [place] today for discussion of possible radiation therapy to the new skeletal metastases.  PREVIOUS RADIATION THERAPY: Yes 08/16/21 - 08/27/21:  The PET positive intrathoracic lymph node targets were treated to 50 Gy in 5 fractions of 10 Gy each  12/02/19 - 01/23/20: 1. The prostate fossa and pelvic lymph nodes were initially treated to 45 Gy in 25 fractions of 1.8 Gy  2. The prostate fossa only was boosted to 68.4 Gy with 13 additional fractions of 1.8 Gy   PAST MEDICAL HISTORY:  has  a past medical history of Anxiety, Arthritis, Depression, Family history of breast cancer, Family history of prostate cancer, GERD (gastroesophageal reflux disease), Osteoporosis, Prostate cancer (HCC), and Skin cancer.    PAST SURGICAL HISTORY: Past Surgical History:  Procedure Laterality Date    BICEPS TENDON REPAIR Left 07/2016   EYE SURGERY Bilateral 2005   LYMPHADENECTOMY Bilateral 06/27/2019   Procedure: LYMPHADENECTOMY, PELVIC;  Surgeon: Heloise Purpura, MD;  Location: [facility];  Service: Urology;  Laterality: Bilateral;   MOLE REMOVAL  2008   pre CA from face   NASAL SINUS SURGERY  2010   ROBOT ASSISTED LAPAROSCOPIC RADICAL PROSTATECTOMY N/A 06/27/2019   Procedure: XI ROBOTIC ASSISTED LAPAROSCOPIC RADICAL PROSTATECTOMY LEVEL 2;  Surgeon: Heloise Purpura, MD;  Location: [facility];  Service: Urology;  Laterality: N/A;   SHOULDER ARTHROSCOPY WITH ROTATOR CUFF REPAIR Left 11/2016    FAMILY HISTORY: family history includes Asthma in his paternal grandmother; Brain cancer in his paternal uncle; Breast cancer in his paternal aunt; Breast cancer (age of onset: 7) in his mother; Cancer in his father; Cancer (age of onset: 26) in his nephew; Heart attack in his maternal grandmother; Leukemia in his mother; Prostate cancer in his paternal grandfather; Stroke in his maternal grandfather.  SOCIAL HISTORY:  reports that he quit smoking about 39 years ago. His smoking use included cigars and cigarettes. He started smoking about 49 years ago. He has a 2.5 pack-year smoking history. He has never used smokeless tobacco. He reports current alcohol use. He reports that he does not use drugs.  ALLERGIES: Codeine  MEDICATIONS:  Current Outpatient Medications  Medication Sig Dispense Refill   Al Hyd-Mg Tr-Alg Ac-Sod Bicarb (GAVISCON-2 PO) Take 2 tablets by mouth at bedtime.     ascorbic acid (VITAMIN C) 250 MG tablet Take 500 mg by mouth daily at 12 noon.     Calcium Carbonate Antacid 400 MG CHEW Chew 800 mg by mouth 2 (two) times daily.     celecoxib (CELEBREX) 200 MG capsule Take 200 mg by mouth daily.     cetirizine (ZYRTEC) 10 MG tablet Take 10 mg by mouth daily as needed for allergies.     ciprofloxacin (CILOXAN) 0.3 % ophthalmic solution Place 1 drop into the right eye every 4 (four) hours while  awake.     denosumab (PROLIA) 60 MG/ML SOSY injection Inject 60 mg into the skin every 6 (six) months.     esomeprazole (NEXIUM) 40 MG capsule Take 40 mg by mouth daily.     Multiple Vitamin (MULTIVITAMIN) tablet Take 1 tablet by mouth daily.     Multiple Vitamins-Minerals (MULTIVITAMIN ADULT, MINERALS,) TABS Take 1 tablet by mouth daily.     Multiple Vitamins-Minerals (ZINC PO) Take 1 tablet by mouth every other day.     sildenafil (VIAGRA) 100 MG tablet Take 25 mg by mouth daily as needed for erectile dysfunction.     tizanidine (ZANAFLEX) 2 MG capsule Take 2 mg by mouth daily as needed for muscle spasms.     traZODone (DESYREL) 100 MG tablet Take 100 mg by mouth at bedtime.     Vitamin D, Ergocalciferol, (DRISDOL) 1.25 MG (50000 UNIT) CAPS capsule Take 50,000 Units by mouth once a week. Wednesday     No current facility-administered medications for this encounter.    REVIEW OF SYSTEMS:  On review of systems, the patient reports that he is doing well overall. He denies any chest pain, shortness of breath, cough, fevers, chills, night sweats, unintended weight changes. He  denies any bowel disturbances, and denies abdominal pain, nausea or vomiting. He denies any new musculoskeletal or joint aches or pains. His IPSS was 6, indicating minimal urinary symptoms. A complete review of systems is obtained and is otherwise negative.   PHYSICAL EXAM:  Wt Readings from Last 3 Encounters:  06/08/22 190 lb 4.8 oz (86.3 kg)  12/08/21 184 lb (83.5 kg)  09/05/21 174 lb (78.9 kg)   Temp Readings from Last 3 Encounters:  06/08/22 98.9 F (37.2 C) (Oral)  12/08/21 97.9 F (36.6 C) (Oral)  09/06/21 97.9 F (36.6 C) (Oral)   BP Readings from Last 3 Encounters:  06/08/22 112/73  12/08/21 130/82  09/06/21 110/78   Pulse Readings from Last 3 Encounters:  06/08/22 84  12/08/21 70  09/06/21 90    /10  In general this is a well appearing Caucasian male in no acute distress. He's alert and oriented  x4 and appropriate throughout the examination. Cardiopulmonary assessment is negative for acute distress and he exhibits normal effort.    KPS = 100  100 - Normal; no complaints; no evidence of disease. 90   - Able to carry on normal activity; minor signs or symptoms of disease. 80   - Normal activity with effort; some signs or symptoms of disease. 64   - Cares for self; unable to carry on normal activity or to do active work. 60   - Requires occasional assistance, but is able to care for most of his personal needs. 50   - Requires considerable assistance and frequent medical care. 40   - Disabled; requires special care and assistance. 30   - Severely disabled; hospital admission is indicated although death not imminent. 20   - Very sick; hospital admission necessary; active supportive treatment necessary. 10   - Moribund; fatal processes progressing rapidly. 0     - Dead  Karnofsky DA, Abelmann WH, Craver LS and Burchenal [facility] [phone]) The use of the nitrogen mustards in the palliative treatment of carcinoma: with particular reference to bronchogenic carcinoma Cancer 1 [phone]   LABORATORY DATA:  Lab Results  Component Value Date   WBC 3.7 (L) 09/05/2021   HGB 14.7 09/05/2021   HCT 41.7 09/05/2021   MCV 86.9 09/05/2021   PLT 124 (L) 09/05/2021   Lab Results  Component Value Date   NA 138 09/05/2021   K 3.9 09/05/2021   CL 104 09/05/2021   CO2 24 09/05/2021   Lab Results  Component Value Date   ALT 15 09/05/2021   AST 14 (L) 09/05/2021   ALKPHOS 53 09/05/2021   BILITOT 0.8 09/05/2021     RADIOGRAPHY: NM PET (PSMA) SKULL TO MID THIGH  Result Date: 10/19/2022 CLINICAL DATA:  Prostate carcinoma with biochemical recurrence. EXAM: NUCLEAR MEDICINE PET SKULL BASE TO THIGH TECHNIQUE: 8.3 mCi F18 Piflufolastat (Pylarify) was injected intravenously. Full-ring PET imaging was performed from the skull base to thigh after the radiotracer. CT data was obtained and used for attenuation  correction and anatomic localization. COMPARISON:  None Available. FINDINGS: NECK No radiotracer activity in neck lymph nodes. Incidental CT finding: None. CHEST No radiotracer accumulation within mediastinal or hilar lymph nodes. No suspicious pulmonary nodules on the CT scan. Interval resolution of the radiotracer activity previously associated with 2 small mediastinal lymph nodes. Incidental CT finding: None. ABDOMEN/PELVIS Prostate: No focal activity in the prostate fossa. Lymph nodes: No abnormal radiotracer accumulation within pelvic or abdominal nodes. Liver: No evidence of liver metastasis. Incidental CT finding: None. SKELETON  New small focus intense radiotracer activity within the central L1 vertebral body (image 145). Tiny subcentimeter sclerotic focus associated with this lesion. Second lesion within the LEFT transverse process of the T11 vertebral body with SUV max equal 7.0 on image 131. Small sclerotic lesion present IMPRESSION: 1. Two new small skeletal metastasis with intense radiotracer activity: L1 vertebral body and LEFT transverse process of T11. 2. No evidence of local prostate carcinoma recurrence in the prostate bed. 3. No evidence of metastatic adenopathy in the pelvis or periaortic retroperitoneum. 4. Interval resolution of radiotracer activity small mediastinal lymph nodes. Electronically Signed   By: Genevive Bi M.D.   On: 10/19/2022 12:18      IMPRESSION/PLAN: 67 y.o. 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 We discussed the natural history of prostate cancer and general treatment, highlighting the role of radiotherapy in the management of oligometastatic disease. We discussed the available radiation techniques, and focused on the details and logistics of delivery.  The recommendation is for a 5 fraction course of stereotactic body radiotherapy (SBRT) targeting  the PET positive skeletal lesions at T11 and L1.  We reviewed the anticipated acute and late sequelae associated with radiation in this setting. The patient was encouraged to ask questions that were answered to his stated satisfaction.  At the end of the conversation, the patient is interested in moving forward with the recommended 5 fraction course of stereotactic body radiotherapy (SBRT) targeting the PET positive skeletal lesions at T11 and L1.  He appears to have a good understanding of his disease and our treatment recommendations which are of curative intent.  He has freely signed written consent to proceed today in the office and a copy of this document will be placed in his medical record.  We will share our discussion with Dr. Laverle Patter and proceed with treatment planning accordingly. He is tentatively scheduled for CT Simulation at 1pm on Tuesday 11/08/22, in anticipation of beginning his radiation treatments in the very near future.  We enjoyed meeting with him and his wife again today and look forward to continuing to participate in his care.  We personally spent 45 minutes in this encounter including chart review, reviewing radiological studies, meeting face-to-face with the patient, entering orders and completing documentation.    Marguarite Arbour, PA-C    Margaretmary Dys, MD  [facility]  Radiation Oncology Direct Dial: [phone]  Fax: [phone] [email or web address]  Skype  LinkedIn

## 2022-11-03 NOTE — Progress Notes (Signed)
Nursing interview for Oligometastatic prostate cancer involving the spine at T11 and L1. Patient identity verified x2.  Patient reports dorsalgia 5/10. No other issues conveyed at this time.  Meaningful use complete.  Vitals- BP 122/83 (BP Location: Left Arm, Patient Position: Sitting, Cuff Size: Normal)   Pulse 76   Temp (!) 97.3 F (36.3 C) (Temporal)   Resp 18   Ht 5\' 8"  (1.727 m)   Wt 189 lb 2 oz (85.8 kg)   SpO2 98%   BMI 28.76 kg/m   This concludes the interaction.  Ruel Favors, LPN

## 2022-11-08 ENCOUNTER — Other Ambulatory Visit: Payer: Self-pay

## 2022-11-08 ENCOUNTER — Ambulatory Visit
Admission: RE | Admit: 2022-11-08 | Discharge: 2022-11-08 | Disposition: A | Discharge status: Home / Self Care | Payer: Medicare Other | Source: Ambulatory Visit | Attending: Radiation Oncology | Admitting: Radiation Oncology

## 2022-11-08 DIAGNOSIS — C61 Malignant neoplasm of prostate: Secondary | ICD-10-CM | POA: Insufficient documentation

## 2022-11-08 DIAGNOSIS — C7951 Secondary malignant neoplasm of bone: Secondary | ICD-10-CM | POA: Diagnosis present

## 2022-11-08 NOTE — Progress Notes (Signed)
  Radiation Oncology         [phone] ________________________________  Name: Brendan Anderson MRN: [id number]  Date: 11/08/2022  DOB: [birth year]  STEREOTACTIC BODY RADIOTHERAPY SIMULATION AND TREATMENT PLANNING NOTE    ICD-10-CM   1. Malignant neoplasm of prostate metastatic to bone (HCC)  C61    C79.51       DIAGNOSIS:  68 y.o. 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  Identity was confirmed.  All relevant records and images related to the planned course of therapy were reviewed.  The patient freely provided informed written consent to proceed with treatment after reviewing the details related to the planned course of therapy. The consent form was witnessed and verified by the simulation staff.  Then, the patient was set-up in a stable reproducible  supine position for radiation therapy.  A BodyFix immobilization pillow was fabricated for reproducible positioning.  Surface markings were placed.  The CT images were loaded into the planning software.  The gross target volumes (GTV) and planning target volumes (PTV) were delinieated, and avoidance structures were contoured.  Treatment planning then occurred.  The radiation prescription was entered and confirmed.  A total of two complex treatment devices were fabricated in the form of the BodyFix immobilization pillow and a neck accuform cushion.  I have requested : 3D Simulation  I have requested a DVH of the following structures: targets and all normal structures near the target including kidneys, bowel, spinal cord, skin and others as noted on the radiation plan to maintain doses in adherence with established limits  SPECIAL TREATMENT PROCEDURE:  The planned course of therapy using radiation constitutes a special treatment procedure. Special care is required in the management of this patient for the following reasons. High dose per  fraction requiring special monitoring for increased toxicities of treatment including daily imaging..  The special nature of the planned course of radiotherapy will require increased physician supervision and oversight to ensure patient's safety with optimal treatment outcomes.    This requires extended time and effort.    PLAN:  The patient will receive 50 Gy in 5 fractions to the PET avid mets with 20 Gy in 4 fractions to contiguous marrow space.  ________________________________  Artist Pais Kathrynn Running, M.D.

## 2022-11-16 ENCOUNTER — Telehealth: Payer: Self-pay | Admitting: Radiation Oncology

## 2022-11-16 DIAGNOSIS — C7951 Secondary malignant neoplasm of bone: Secondary | ICD-10-CM | POA: Diagnosis present

## 2022-11-16 DIAGNOSIS — C61 Malignant neoplasm of prostate: Secondary | ICD-10-CM | POA: Insufficient documentation

## 2022-11-16 NOTE — Telephone Encounter (Signed)
9/4 @ 4:25 pm Received voicemail from patient to confirm his treatment appointment for 9/10, due to getting mychart message that his appointment was cancel.  Patient is aware to be here on 9/10 @ 1:00 pm for his treatment.

## 2022-11-22 ENCOUNTER — Other Ambulatory Visit: Payer: Self-pay

## 2022-11-22 ENCOUNTER — Ambulatory Visit
Admission: RE | Admit: 2022-11-22 | Discharge: 2022-11-22 | Disposition: A | Discharge status: Home / Self Care | Payer: Medicare Other | Source: Ambulatory Visit | Attending: Radiation Oncology | Admitting: Radiation Oncology

## 2022-11-22 DIAGNOSIS — C7951 Secondary malignant neoplasm of bone: Secondary | ICD-10-CM | POA: Diagnosis not present

## 2022-11-22 LAB — RAD ONC ARIA SESSION SUMMARY
Course Elapsed Days: 0
Plan Fractions Treated to Date: 1
Plan Prescribed Dose Per Fraction: 10 Gy
Plan Total Fractions Prescribed: 5
Plan Total Prescribed Dose: 50 Gy
Reference Point Dosage Given to Date: 10 Gy
Reference Point Session Dosage Given: 10 Gy
Session Number: 1

## 2022-11-23 ENCOUNTER — Ambulatory Visit: Payer: Medicare Other

## 2022-11-23 ENCOUNTER — Ambulatory Visit
Admission: RE | Admit: 2022-11-23 | Discharge: 2022-11-23 | Disposition: A | Discharge status: Home / Self Care | Payer: Medicare Other | Source: Ambulatory Visit | Attending: Radiation Oncology | Admitting: Radiation Oncology

## 2022-11-23 ENCOUNTER — Other Ambulatory Visit: Payer: Self-pay

## 2022-11-23 DIAGNOSIS — C7951 Secondary malignant neoplasm of bone: Secondary | ICD-10-CM | POA: Diagnosis not present

## 2022-11-23 LAB — RAD ONC ARIA SESSION SUMMARY
Course Elapsed Days: 1
Plan Fractions Treated to Date: 2
Plan Prescribed Dose Per Fraction: 10 Gy
Plan Total Fractions Prescribed: 5
Plan Total Prescribed Dose: 50 Gy
Reference Point Dosage Given to Date: 20 Gy
Reference Point Session Dosage Given: 10 Gy
Session Number: 2

## 2022-11-24 ENCOUNTER — Ambulatory Visit: Payer: Medicare Other

## 2022-11-25 ENCOUNTER — Ambulatory Visit: Payer: Medicare Other

## 2022-11-28 ENCOUNTER — Ambulatory Visit
Admission: RE | Admit: 2022-11-28 | Discharge: 2022-11-28 | Disposition: A | Discharge status: Home / Self Care | Payer: Medicare Other | Source: Ambulatory Visit | Attending: Radiation Oncology

## 2022-11-28 ENCOUNTER — Other Ambulatory Visit: Payer: Self-pay

## 2022-11-28 DIAGNOSIS — C7951 Secondary malignant neoplasm of bone: Secondary | ICD-10-CM | POA: Diagnosis not present

## 2022-11-28 LAB — RAD ONC ARIA SESSION SUMMARY
Course Elapsed Days: 6
Plan Fractions Treated to Date: 3
Plan Prescribed Dose Per Fraction: 10 Gy
Plan Total Fractions Prescribed: 5
Plan Total Prescribed Dose: 50 Gy
Reference Point Dosage Given to Date: 30 Gy
Reference Point Session Dosage Given: 10 Gy
Session Number: 3

## 2022-11-30 ENCOUNTER — Ambulatory Visit
Admission: RE | Admit: 2022-11-30 | Discharge: 2022-11-30 | Disposition: A | Discharge status: Home / Self Care | Payer: Medicare Other | Source: Ambulatory Visit | Attending: Radiation Oncology | Admitting: Radiation Oncology

## 2022-11-30 ENCOUNTER — Other Ambulatory Visit: Payer: Self-pay

## 2022-11-30 DIAGNOSIS — C7951 Secondary malignant neoplasm of bone: Secondary | ICD-10-CM | POA: Diagnosis not present

## 2022-11-30 LAB — RAD ONC ARIA SESSION SUMMARY
Course Elapsed Days: 8
Plan Fractions Treated to Date: 4
Plan Prescribed Dose Per Fraction: 10 Gy
Plan Total Fractions Prescribed: 5
Plan Total Prescribed Dose: 50 Gy
Reference Point Dosage Given to Date: 40 Gy
Reference Point Session Dosage Given: 10 Gy
Session Number: 4

## 2022-12-02 ENCOUNTER — Ambulatory Visit: Payer: Medicare Other

## 2022-12-02 ENCOUNTER — Ambulatory Visit
Admission: RE | Admit: 2022-12-02 | Discharge: 2022-12-02 | Disposition: A | Discharge status: Home / Self Care | Payer: Medicare Other | Source: Ambulatory Visit | Attending: Radiation Oncology | Admitting: Radiation Oncology

## 2022-12-02 ENCOUNTER — Other Ambulatory Visit: Payer: Self-pay

## 2022-12-02 DIAGNOSIS — C7951 Secondary malignant neoplasm of bone: Secondary | ICD-10-CM | POA: Diagnosis not present

## 2022-12-02 LAB — RAD ONC ARIA SESSION SUMMARY
Course Elapsed Days: 10
Plan Fractions Treated to Date: 5
Plan Prescribed Dose Per Fraction: 10 Gy
Plan Total Fractions Prescribed: 5
Plan Total Prescribed Dose: 50 Gy
Reference Point Dosage Given to Date: 50 Gy
Reference Point Session Dosage Given: 10 Gy
Session Number: 5

## 2022-12-05 NOTE — Radiation Completion Notes (Addendum)
Radiation Oncology         [phone]) [phone] ________________________________  Name: Brendan Anderson MRN: [id number]  Date: 12/02/2022  DOB: 1956-02-01  Referring Physician: Crecencio Mc, M.D. Date of Service: 2022-12-05 Radiation Oncologist: Margaretmary Bayley, M.D. [facility]     RADIATION ONCOLOGY END OF TREATMENT NOTE     Diagnosis:  67 y.o. 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  He tolerated the radiation treatments relatively well with only modest fatigue.  The patient will receive a call in about one month from the radiation oncology department. He will continue follow up with his urologist, Dr. Laverle Patter, as well.  ------------------------------------------------   Margaretmary Dys, MD [facility]  Radiation Oncology Direct Dial: [phone]  Fax: [phone] [email or web address]  Skype  LinkedIn

## 2022-12-12 ENCOUNTER — Other Ambulatory Visit: Payer: Medicare Other

## 2022-12-12 ENCOUNTER — Ambulatory Visit: Payer: Medicare Other | Admitting: Hematology

## 2022-12-14 NOTE — Progress Notes (Signed)
[facility] [address] [place] [address]    Clinic Day:  12/15/2022  Referring physician: Kathlee Nations, MD  Patient Care Team: Kathlee Nations, MD as PCP - General (Internal Medicine) Felicita Gage, RN as Registered Nurse (Medical Oncology) Doreatha Massed, MD as Medical Oncologist (Medical Oncology) Therese Sarah, RN as Oncology Nurse Navigator (Medical Oncology)   ASSESSMENT & PLAN:   Assessment:   1.  Stage IV (pT3b pN1 M1) prostate cancer: - Prostate biopsy (04/24/2019): Prostatic adenocarcinoma, Gleason's 4+5=9, done in [place] by Dr. Ian Bushman.  PSA was 6.3. - Radical prostatectomy and lymph node resection (06/27/2019) - Pathology: Prostatic adenocarcinoma, Gleason 5+4=9 (grade group 5).  Extraprostatic extension is present at the right posterior mid, bladder neck and bilateral posterior base, bilateral seminal vesicles invasion present (pT3b).  LVI present.  Margins negative.  Metastatic adenocarcinoma in 1/4 lymph nodes in the right pelvis.  0/5 lymph nodes in the left pelvis.  PT3BPN1. - Postsurgery PSA of 0.2.  10/29/2019 PSA 0.38. - XRT to prostate fossa, pelvic lymph nodes from 12/02/2019 - 01/23/2020 with short-term ADT - PSA initially became undetectable, but quickly began increasing and was noted to have biochemical recurrence in November 2022, PSA 0.46.  PSA increased to 2.04 in February 2023. - PSMA PET scan (07/05/2021): 2 very small presumed foci of nodal tissue in the left middle mediastinum along the descending thoracic aorta concerning for nodal prostate cancer metastasis.  No evidence of local recurrence in the prostatectomy bed or nodal metastasis in the pelvis.  No evidence of visceral or skeletal metastasis. - Germline mutation testing ([facility]) negative on 12/11/2019 - SBRT to the PET positive intrathoracic lymph nodes, 50 Gray in 5 fractions from 08/16/2021 through 08/27/2021. - PSA: 2.26 (10/22/2021), 2.04 (06/08/2021), 0.46 (02/09/2021), 0.17  (11/05/2020), <0.015 (04/22/2020), 0.38 (10/29/2019), 0.20 (09/19/2019). - Total testosterone 427 (11/04/2020) - PSMA PET scan (10/16/2022): New small focus of activity within the central and 1 vertebral body and within the left transverse process of the T11 vertebral body.  No evidence of cancer in the prostate bed or adenopathy. - Status post XRT 50 Gray in 5 fractions completed on 11/22/2022 - ADT with abiraterone started on 12/15/2022   2.  Social/family history: - He is married and lives with his wife.  He worked with heating and air conditioning.  He had very little exposure to asbestos.  No other chemical exposure. - He quit smoking in 1985. - Mother had breast cancer in her early 74s and died with acute leukemia few months after receiving chemotherapy for breast cancer.  Plan:  1.  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 - He underwent radiation therapy to both lesions completed on 11/22/2022. - I have recommended systemic therapy with ADT and abiraterone/prednisone.  His PSA today has increased to 12.87.  He may also be a candidate for triplet therapy based on Gleason score of 9 (definition of "high risk" disease as used in LATITUDE trial). - We discussed side effects in detail.  He is agreeable. - He will receive Firmagon injection today.  We will switch him to Lupron 45 mg in 4 weeks. - I have sent prescription for abiraterone to our specialty pharmacy and prednisone to his regular pharmacy.  He will start it once he receives shipment.  I will see him back in 3 weeks for follow-up.   2.  Osteoporosis: -  Continue Prolia injections every 6 months with Dr. Orvan Falconer in [place].  Continue calcium and vitamin D supplements.  No orders of the defined types were placed in this encounter.     [name],acting as a Neurosurgeon for Doreatha Massed, MD.,have documented all relevant documentation on the  behalf of Doreatha Massed, MD,as directed by  Doreatha Massed, MD while in the presence of Doreatha Massed, MD.   I, Doreatha Massed MD, have reviewed the above documentation for accuracy and completeness, and I agree with the above.   Helena R Teague   10/3/202410:39 AM  CHIEF COMPLAINT:   Diagnosis: Prostate cancer metastatic to intrapelvic lymph node    Cancer Staging  No matching staging information was found for the patient.    Prior Therapy:  Radical prostatectomy and BPL ND on 06/27/2019  XRT to prostate fossa, pelvic lymph nodes from 12/02/2019 - 01/23/2020 with short-term ADT  SBRT to the mediastinal lymph nodes, 50 Gray in 5 fractions from 08/16/2021 through 08/27/2021  Current Therapy: ADT and abiraterone/prednisone   HISTORY OF PRESENT ILLNESS:   Oncology History  Prostate cancer metastatic to intrapelvic lymph node (HCC)  08/07/2018 Initial Diagnosis   Prostate cancer metastatic to intrapelvic lymph node (HCC)   12/19/2019 Genetic Testing   Negative genetic testing on the multicancer panel.  The Multi-Gene Panel offered by [facility] includes sequencing and/or deletion duplication testing of the following 85 genes: AIP, ALK, APC, ATM, AXIN2,BAP1,  BARD1, BLM, BMPR1A, BRCA1, BRCA2, BRIP1, CASR, CDC73, CDH1, CDK4, CDKN1B, CDKN1C, CDKN2A (p14ARF), CDKN2A (p16INK4a), CEBPA, CHEK2, CTNNA1, DICER1, DIS3L2, EGFR (c.2369C>T, p.Thr790Met variant only), EPCAM (Deletion/duplication testing only), FH, FLCN, GATA2, GPC3, GREM1 (Promoter region deletion/duplication testing only), HOXB13 (c.251G>A, p.Gly84Glu), HRAS, KIT, MAX, MEN1, MET, MITF (c.952G>A, p.Glu318Lys variant only), MLH1, MSH2, MSH3, MSH6, MUTYH, NBN, NF1, NF2, NTHL1, PALB2, PDGFRA, PHOX2B, PMS2, POLD1, POLE, POT1, PRKAR1A, PTCH1, PTEN, RAD50, RAD51C, RAD51D, RB1, RECQL4, RET, RNF43, RUNX1, SDHAF2, SDHA (sequence changes only), SDHB, SDHC, SDHD, SMAD4, SMARCA4, SMARCB1, SMARCE1, STK11, SUFU, TERC, TERT, TMEM127,  TP53, TSC1, TSC2, VHL, WRN and WT1.  The report date is December 19, 2019.      INTERVAL HISTORY:   Brendan Anderson is a 67 y.o. male presenting to clinic today for follow up of Castration sensitive metastatic prostate cancer. He was last seen by me on 12/08/21.  Since his last visit, he underwent a PET on 10/17/22 that found: two new small skeletal metastasis with intense radiotracer activity; L1 vertebral body and LEFT transverse process of T11; no evidence of local prostate carcinoma recurrence in the prostate bed; no evidence of metastatic adenopathy in the pelvis or periaortic retroperitoneum; and interval resolution of radiotracer activity small mediastinal lymph nodes.  Today, he states that he is doing well overall. His appetite level is at 100%. His energy level is at 75%. He is accompanied by his wife.   He reports increased tiredness and that he finished 5 rounds of XRT on 11/22/22. He c/o left flank pain and pain in the left shoulder that occasionally radiates down the arm. His wife reports he was given 1 anti-testosterone shot after his prostatectomy. He denies any heart issues. He had genetic testing in 2021 that was negative.   PAST MEDICAL HISTORY:   Past Medical History: Past Medical History:  Diagnosis Date   Anxiety    Arthritis    hip knees wrist   Depression    Family history of breast cancer    Family history of prostate cancer    GERD (  gastroesophageal reflux disease)    Osteoporosis    Prostate cancer [facility])    Skin cancer     Surgical History: Past Surgical History:  Procedure Laterality Date   BICEPS TENDON REPAIR Left 07/2016   EYE SURGERY Bilateral 2005   LYMPHADENECTOMY Bilateral 06/27/2019   Procedure: LYMPHADENECTOMY, PELVIC;  Surgeon: Heloise Purpura, MD;  Location: [facility];  Service: Urology;  Laterality: Bilateral;   MOLE REMOVAL  2008   pre CA from face   NASAL SINUS SURGERY  2010   ROBOT ASSISTED LAPAROSCOPIC RADICAL PROSTATECTOMY N/A 06/27/2019   Procedure:  XI ROBOTIC ASSISTED LAPAROSCOPIC RADICAL PROSTATECTOMY LEVEL 2;  Surgeon: Heloise Purpura, MD;  Location: [facility];  Service: Urology;  Laterality: N/A;   SHOULDER ARTHROSCOPY WITH ROTATOR CUFF REPAIR Left 11/2016    Social History: Social History   Socioeconomic History   Marital status: Married    Spouse name: Kendal Hymen   Number of children: 2   Years of education: Not on file   Highest education level: Not on file  Occupational History   Occupation: retired    Comment: Garment/textile technologist  Tobacco Use   Smoking status: Former    Current packs/day: 0.00    Average packs/day: 0.3 packs/day for 10.0 years (2.5 ttl pk-yrs)    Types: Cigars, Cigarettes    Start date: 06/18/1973    Quit date: 06/19/1983    Years since quitting: 39.5   Smokeless tobacco: Never  Vaping Use   Vaping status: Never Used  Substance and Sexual Activity   Alcohol use: Yes    Comment: rare   Drug use: Never   Sexual activity: Not Currently  Other Topics Concern   Not on file  Social History Narrative   Not on file   Social Determinants of Health   Financial Resource Strain: Not on file  Food Insecurity: No Food Insecurity (11/03/2022)   Hunger Vital Sign    Worried About Running Out of Food in the Last Year: Never true    Ran Out of Food in the Last Year: Never true  Transportation Needs: No Transportation Needs (11/03/2022)   PRAPARE - Administrator, Civil Service (Medical): No    Lack of Transportation (Non-Medical): No  Physical Activity: Not on file  Stress: Not on file  Social Connections: Not on file  Intimate Partner Violence: Not At Risk (11/03/2022)   Humiliation, Afraid, Rape, and Kick questionnaire    Fear of Current or Ex-Partner: No    Emotionally Abused: No    Physically Abused: No    Sexually Abused: No    Family History: Family History  Problem Relation Age of Onset   Leukemia Mother    Breast cancer Mother 42   Cancer Father        NOS   Breast cancer  Paternal Aunt        dx in her 72s   Brain cancer Paternal Uncle        d. 48s   Heart attack Maternal Grandmother    Stroke Maternal Grandfather    Asthma Paternal Grandmother    Prostate cancer Paternal Grandfather    Cancer Nephew 10       brother's grandson - NOS   Colon cancer Neg Hx    Pancreatic cancer Neg Hx     Current Medications:  Current Outpatient Medications:    Al Hyd-Mg Tr-Alg Ac-Sod Bicarb (GAVISCON-2 PO), Take 2 tablets by mouth at bedtime., Disp: , Rfl:  ascorbic acid (VITAMIN C) 250 MG tablet, Take 500 mg by mouth daily at 12 noon., Disp: , Rfl:    Calcium Carbonate Antacid 400 MG CHEW, Chew 800 mg by mouth 2 (two) times daily., Disp: , Rfl:    celecoxib (CELEBREX) 200 MG capsule, Take 200 mg by mouth daily., Disp: , Rfl:    cetirizine (ZYRTEC) 10 MG tablet, Take 10 mg by mouth daily as needed for allergies., Disp: , Rfl:    ciprofloxacin (CILOXAN) 0.3 % ophthalmic solution, Place 1 drop into the right eye every 4 (four) hours while awake., Disp: , Rfl:    denosumab (PROLIA) 60 MG/ML SOSY injection, Inject 60 mg into the skin every 6 (six) months., Disp: , Rfl:    esomeprazole (NEXIUM) 40 MG capsule, Take 40 mg by mouth daily., Disp: , Rfl:    Multiple Vitamin (MULTIVITAMIN) tablet, Take 1 tablet by mouth daily., Disp: , Rfl:    Multiple Vitamins-Minerals (MULTIVITAMIN ADULT, MINERALS,) TABS, Take 1 tablet by mouth daily., Disp: , Rfl:    Multiple Vitamins-Minerals (ZINC PO), Take 1 tablet by mouth every other day., Disp: , Rfl:    sildenafil (VIAGRA) 100 MG tablet, Take 25 mg by mouth daily as needed for erectile dysfunction., Disp: , Rfl:    tizanidine (ZANAFLEX) 2 MG capsule, Take 2 mg by mouth daily as needed for muscle spasms., Disp: , Rfl:    traZODone (DESYREL) 100 MG tablet, Take 100 mg by mouth at bedtime., Disp: , Rfl:    Vitamin D, Ergocalciferol, (DRISDOL) 1.25 MG (50000 UNIT) CAPS capsule, Take 50,000 Units by mouth once a week. Wednesday, Disp: ,  Rfl:    Allergies: Allergies  Allergen Reactions   Codeine Other (See Comments)    "MAKES HIM HYPER"    REVIEW OF SYSTEMS:   Review of Systems  Constitutional:  Negative for chills, fatigue and fever.  HENT:   Negative for lump/mass, mouth sores, nosebleeds, sore throat and trouble swallowing.   Eyes:  Negative for eye problems.  Respiratory:  Negative for cough and shortness of breath.   Cardiovascular:  Negative for chest pain, leg swelling and palpitations.  Gastrointestinal:  Positive for constipation. Negative for abdominal pain, diarrhea, nausea and vomiting.  Genitourinary:  Negative for bladder incontinence, difficulty urinating, dysuria, frequency, hematuria and nocturia.   Musculoskeletal:  Positive for arthralgias (left shoulder, 4/10 severity). Negative for back pain, flank pain, myalgias and neck pain.       +left flank pain, 4/10 severity  Skin:  Negative for itching and rash.  Neurological:  Positive for headaches. Negative for dizziness and numbness.  Hematological:  Does not bruise/bleed easily.  Psychiatric/Behavioral:  Positive for sleep disturbance. Negative for depression and suicidal ideas. The patient is not nervous/anxious.   All other systems reviewed and are negative.    VITALS:   There were no vitals taken for this visit.  Wt Readings from Last 3 Encounters:  11/03/22 189 lb 2 oz (85.8 kg)  06/08/22 190 lb 4.8 oz (86.3 kg)  12/08/21 184 lb (83.5 kg)    There is no height or weight on file to calculate BMI.  Performance status (ECOG): 1 - Symptomatic but completely ambulatory  PHYSICAL EXAM:   Physical Exam Vitals and nursing note reviewed. Exam conducted with a chaperone present.  Constitutional:      Appearance: Normal appearance.  Cardiovascular:     Rate and Rhythm: Normal rate and regular rhythm.     Pulses: Normal pulses.  Heart sounds: Normal heart sounds.  Pulmonary:     Effort: Pulmonary effort is normal.     Breath sounds:  Normal breath sounds.  Abdominal:     Palpations: Abdomen is soft. There is no hepatomegaly, splenomegaly or mass.     Tenderness: There is no abdominal tenderness.  Musculoskeletal:     Right lower leg: No edema.     Left lower leg: No edema.  Lymphadenopathy:     Cervical: No cervical adenopathy.     Right cervical: No superficial, deep or posterior cervical adenopathy.    Left cervical: No superficial, deep or posterior cervical adenopathy.     Upper Body:     Right upper body: No supraclavicular or axillary adenopathy.     Left upper body: No supraclavicular or axillary adenopathy.  Neurological:     General: No focal deficit present.     Mental Status: He is alert and oriented to person, place, and time.  Psychiatric:        Mood and Affect: Mood normal.        Behavior: Behavior normal.     LABS:      Latest Ref Rng & Units 09/05/2021    9:45 PM 06/28/2019   10:49 AM 06/28/2019    4:56 AM  CBC  WBC 4.0 - 10.5 K/uL 3.7     Hemoglobin 13.0 - 17.0 g/dL 16.1  09.6  04.5   Hematocrit 39.0 - 52.0 % 41.7  33.8  34.1   Platelets 150 - 400 K/uL 124         Latest Ref Rng & Units 09/05/2021   11:45 PM 09/05/2021    9:45 PM 06/19/2019    2:08 PM  CMP  Glucose 70 - 99 mg/dL  409  811   BUN 8 - 23 mg/dL  19  20   Creatinine 9.14 - 1.24 mg/dL  7.82  9.56   Sodium 213 - 145 mmol/L  138  141   Potassium 3.5 - 5.1 mmol/L  3.9  4.1   Chloride 98 - 111 mmol/L  104  106   CO2 22 - 32 mmol/L  24  26   Calcium 8.9 - 10.3 mg/dL  8.9  9.1   Total Protein 6.5 - 8.1 g/dL 6.9     Total Bilirubin 0.3 - 1.2 mg/dL 0.8     Alkaline Phos 38 - 126 U/L 53     AST 15 - 41 U/L 14     ALT 0 - 44 U/L 15        No results found for: "CEA1", "CEA" / No results found for: "CEA1", "CEA" No results found for: "PSA1" No results found for: "YQM578" No results found for: "CAN125"  No results found for: "TOTALPROTELP", "ALBUMINELP", "A1GS", "A2GS", "BETS", "BETA2SER", "GAMS", "MSPIKE", "SPEI" No  results found for: "TIBC", "FERRITIN", "IRONPCTSAT" No results found for: "LDH"   STUDIES:   No results found.

## 2022-12-15 ENCOUNTER — Inpatient Hospital Stay: Payer: Medicare Other

## 2022-12-15 ENCOUNTER — Inpatient Hospital Stay: Payer: Medicare Other | Admitting: Hematology

## 2022-12-15 ENCOUNTER — Inpatient Hospital Stay: Payer: Medicare Other | Attending: Hematology

## 2022-12-15 ENCOUNTER — Other Ambulatory Visit (HOSPITAL_COMMUNITY): Payer: Self-pay

## 2022-12-15 ENCOUNTER — Encounter: Payer: Self-pay | Admitting: Hematology

## 2022-12-15 ENCOUNTER — Telehealth: Payer: Self-pay

## 2022-12-15 ENCOUNTER — Inpatient Hospital Stay (HOSPITAL_BASED_OUTPATIENT_CLINIC_OR_DEPARTMENT_OTHER): Payer: Medicare Other | Admitting: Hematology

## 2022-12-15 VITALS — BP 122/82 | HR 74 | Temp 97.9°F | Resp 16 | Wt 187.8 lb

## 2022-12-15 DIAGNOSIS — C7951 Secondary malignant neoplasm of bone: Secondary | ICD-10-CM

## 2022-12-15 DIAGNOSIS — Z87891 Personal history of nicotine dependence: Secondary | ICD-10-CM | POA: Insufficient documentation

## 2022-12-15 DIAGNOSIS — C61 Malignant neoplasm of prostate: Secondary | ICD-10-CM

## 2022-12-15 DIAGNOSIS — Z9079 Acquired absence of other genital organ(s): Secondary | ICD-10-CM | POA: Insufficient documentation

## 2022-12-15 DIAGNOSIS — Z923 Personal history of irradiation: Secondary | ICD-10-CM | POA: Diagnosis not present

## 2022-12-15 DIAGNOSIS — C775 Secondary and unspecified malignant neoplasm of intrapelvic lymph nodes: Secondary | ICD-10-CM | POA: Diagnosis not present

## 2022-12-15 DIAGNOSIS — M810 Age-related osteoporosis without current pathological fracture: Secondary | ICD-10-CM | POA: Insufficient documentation

## 2022-12-15 LAB — PSA: Prostatic Specific Antigen: 12.87 ng/mL — ABNORMAL HIGH (ref 0.00–4.00)

## 2022-12-15 MED ORDER — DEGARELIX ACETATE(240 MG DOSE) 120 MG/VIAL ~~LOC~~ SOLR
240.0000 mg | Freq: Once | SUBCUTANEOUS | Status: DC
  Filled 2022-12-15: qty 6

## 2022-12-15 MED ORDER — ABIRATERONE ACETATE 250 MG PO TABS
1000.0000 mg | ORAL_TABLET | Freq: Every day | ORAL | 3 refills | Status: DC
Start: 2022-12-15 — End: 2023-04-03
  Filled 2022-12-16: qty 120, 30d supply, fill #0
  Filled 2023-01-12: qty 120, 30d supply, fill #1
  Filled 2023-02-06: qty 120, 30d supply, fill #2
  Filled 2023-03-07: qty 120, 30d supply, fill #3

## 2022-12-15 MED ORDER — PREDNISONE 5 MG PO TABS: 5.0000 mg | ORAL_TABLET | Freq: Every day | ORAL | 6 refills | Status: DC

## 2022-12-15 NOTE — Patient Instructions (Addendum)
[facility] at [facility] Discharge Instructions   You were seen and examined today by Dr. Ellin Saba.  He noted that your PSA has risen and you have new spots on your bones.   He discussed with you the role of treatment with hormone deprivation therapy with a shot called Lupron or Eligard. He also discussed treatment with a pill called Zytiga. He would like you to review the information and think about if you would like to initiate treatment.   You have decided you would like to proceed with treatment. Brendan Anderson is a pill you will take every day. You will need to take this on an empty stomach at least one hour prior to eating anything. You will also need to be on a low dose of prednisone daily. This should be taken with food (breakfast) every day.   We will also give you a loading dose of an injection called Firmagon. You will get one dose of this today and then we will start you on the Eligard injection every 6 months.   We will see you back in 2 weeks. We will repeat blood work at that time.   Return as scheduled.    Thank you for choosing [facility] at [facility] to provide your oncology and hematology care.  To afford each patient quality time with our provider, please arrive at least 15 minutes before your scheduled appointment time.   If you have a lab appointment with the Cancer Center please come in thru the Main Entrance and check in at the main information desk.  You need to re-schedule your appointment should you arrive 10 or more minutes late.  We strive to give you quality time with our providers, and arriving late affects you and other patients whose appointments are after yours.  Also, if you no show three or more times for appointments you may be dismissed from the clinic at the providers discretion.     Again, thank you for choosing [facility].  Our hope is that these requests will decrease the amount of time that you  wait before being seen by our physicians.       _____________________________________________________________  Should you have questions after your visit to [facility], please contact our office at [phone] and follow the prompts.  Our office hours are 8:00 a.m. and 4:30 p.m. Monday - Friday.  Please note that voicemails left after 4:00 p.m. may not be returned until the following business day.  We are closed weekends and major holidays.  You do have access to a nurse 24-7, just call the main number to the clinic [phone] and do not press any options, hold on the line and a nurse will answer the phone.    For prescription refill requests, have your pharmacy contact our office and allow 72 hours.    Due to Covid, you will need to wear a mask upon entering the hospital. If you do not have a mask, a mask will be given to you at the Main Entrance upon arrival. For doctor visits, patients may have 1 support person age 59 or older with them. For treatment visits, patients can not have anyone with them due to social distancing guidelines and our immunocompromised population.

## 2022-12-15 NOTE — Telephone Encounter (Signed)
Oral Oncology Patient Advocate Encounter  New authorization   Received notification that prior authorization for Abiraterone is required.   PA submitted on 12/15/22  Key [id number]  Status is pending     Ardeen Fillers, CPhT Oncology Pharmacy Patient Advocate  [facility]  [phone] (phone) [phone] (fax) 12/15/2022 11:55 AM

## 2022-12-15 NOTE — Progress Notes (Signed)
Firmagon injections given per orders. Patient tolerated it well without problems. Vitals stable and discharged home from clinic ambulatory. Follow up as scheduled.

## 2022-12-16 ENCOUNTER — Other Ambulatory Visit: Payer: Self-pay

## 2022-12-16 ENCOUNTER — Encounter: Payer: Self-pay | Admitting: Hematology

## 2022-12-16 ENCOUNTER — Other Ambulatory Visit (HOSPITAL_COMMUNITY): Payer: Self-pay

## 2022-12-16 ENCOUNTER — Telehealth: Payer: Self-pay | Admitting: Pharmacist

## 2022-12-16 LAB — TESTOSTERONE: Testosterone: 590 ng/dL (ref 264–916)

## 2022-12-16 NOTE — Progress Notes (Signed)
Specialty Pharmacy Initial Fill Coordination Note  Brendan Anderson is a 67 y.o. male contacted today regarding refills of specialty medication(s) Abiraterone Acetate   Patient requested Delivery   Delivery date: 12/20/22   Verified address: [address] [place], [place] [address]   Medication will be filled on 12/19/22.   Patient is aware of $140.00 copayment. Patient requests bill to [facility].

## 2022-12-16 NOTE — Telephone Encounter (Signed)
Patient successfully OnBoarded and drug education provided by pharmacist. Medication scheduled to be shipped on Monday, 12/19/22 for delivery on Tuesday, 12/20/22 from [facility] to patient's address. Patient also knows to call me at [phone] with any questions or concerns regarding receiving medication or if there is any unexpected change in co-pay.    Ardeen Fillers, CPhT Oncology Pharmacy Patient Advocate  [facility]  [phone] (phone) [phone] (fax) 12/16/2022 4:19 PM

## 2022-12-16 NOTE — Progress Notes (Signed)
Patient education documented in EPIC note on 12/16/22.

## 2022-12-16 NOTE — Telephone Encounter (Signed)
Clinical Pharmacist Practitioner Encounter   Received new prescription for Zytiga (abiraterone) for the treatment of metastatic castration resistant prostate cancer in conjunction with prednisone and ADT, planned duration until disease progression or unacceptable drug toxicity.  CMP from 11/03/22 assessed, no relevant lab abnormalities. Prescription dose and frequency assessed.   Current medication list in Epic reviewed, one DDIs with abiraterone identified.  Evaluated chart and no patient barriers to medication adherence identified.   Prescription has been e-scribed to the [facility] for benefits analysis and approval.  Oral Oncology Clinic will continue to follow for insurance authorization, copayment issues, initial counseling and start date.  Remi Haggard, PharmD, BCPS, BCOP, CPP Hematology/Oncology Clinical Pharmacist Practitioner [place] [phone]  12/16/2022 11:57 AM

## 2022-12-16 NOTE — Telephone Encounter (Signed)
Oral Oncology Patient Advocate Encounter  Prior Authorization for Abiraterone has been approved.    PA# [id number]  Effective dates: 12/02/22 through 03/13/98  Patients co-pay is $292.49.   [facility] would be $140.00.  [facility] would be ~$105.00 with shipping.    Ardeen Fillers, CPhT Oncology Pharmacy Patient Advocate  [facility]  [phone] (phone) [phone] (fax) 12/16/2022 9:27 AM

## 2022-12-16 NOTE — Telephone Encounter (Signed)
Called and went over different prices with patient. Patient would like to use [facility] with $140.00 [facility] price. Patient knows to expect a call from me in the next day or two to OnBoard and set up delivery of Abiraterone.    Ardeen Fillers, CPhT Oncology Pharmacy Patient Advocate  [facility]  [phone] (phone) [phone] (fax) 12/16/2022 10:05 AM

## 2022-12-16 NOTE — Telephone Encounter (Signed)
Clinical Pharmacist Practitioner Encounter   [facility] (Specialty) to delivery medication to patient on 12/20/22.  Patient Education I spoke with patient for overview of new oral chemotherapy medication: Zytiga (abiraterone) for the treatment of metastatic castration resistant prostate cancer in conjunction with prednisone and ADT, planned duration until disease progression or unacceptable drug toxicity.   Counseled patient on administration, dosing, side effects, monitoring, drug-food interactions, safe handling, storage, and disposal. Patient will take: Abiraterone: Zytiga (abiraterone) for the treatment of metastatic castration resistant prostate cancer in conjunction with prednisone and ADT, planned duration until disease progression or unacceptable drug toxicity.  Prednisone: Take 1 tablet (5 mg total) by mouth daily with breakfast.   Side effects include but not limited to: fatigue, hypertension.    Reviewed with patient importance of keeping a medication schedule and plan for any missed doses.  After discussion with patient no patient barriers to medication adherence identified.   Mr. Rabbani voiced understanding and appreciation. All questions answered. Medication handout provided.  Provided patient with Oral Chemotherapy Navigation Clinic phone number. Patient knows to call the office with questions or concerns. Oral Chemotherapy Navigation Clinic will continue to follow.  Remi Haggard, PharmD, BCPS, BCOP, CPP Hematology/Oncology Clinical Pharmacist Practitioner [place] [phone]  12/16/2022 4:04 PM

## 2022-12-19 ENCOUNTER — Encounter: Payer: Self-pay | Admitting: Hematology

## 2022-12-19 ENCOUNTER — Other Ambulatory Visit: Payer: Self-pay

## 2022-12-21 ENCOUNTER — Other Ambulatory Visit (HOSPITAL_COMMUNITY): Payer: Self-pay

## 2023-01-04 ENCOUNTER — Other Ambulatory Visit: Payer: Self-pay

## 2023-01-04 DIAGNOSIS — C61 Malignant neoplasm of prostate: Secondary | ICD-10-CM

## 2023-01-04 NOTE — Progress Notes (Signed)
Lab orders entered

## 2023-01-05 ENCOUNTER — Inpatient Hospital Stay: Payer: Medicare Other | Admitting: Hematology

## 2023-01-05 ENCOUNTER — Other Ambulatory Visit: Payer: Self-pay

## 2023-01-05 ENCOUNTER — Inpatient Hospital Stay: Payer: Medicare Other

## 2023-01-05 VITALS — BP 123/74 | HR 79 | Temp 97.9°F | Resp 18 | Ht 68.0 in | Wt 187.8 lb

## 2023-01-05 DIAGNOSIS — C61 Malignant neoplasm of prostate: Secondary | ICD-10-CM

## 2023-01-05 DIAGNOSIS — C775 Secondary and unspecified malignant neoplasm of intrapelvic lymph nodes: Secondary | ICD-10-CM

## 2023-01-05 LAB — COMPREHENSIVE METABOLIC PANEL
ALT: 18 U/L (ref 0–44)
AST: 17 U/L (ref 15–41)
Albumin: 3.8 g/dL (ref 3.5–5.0)
Alkaline Phosphatase: 52 U/L (ref 38–126)
Anion gap: 9 (ref 5–15)
BUN: 24 mg/dL — ABNORMAL HIGH (ref 8–23)
CO2: 26 mmol/L (ref 22–32)
Calcium: 8.8 mg/dL — ABNORMAL LOW (ref 8.9–10.3)
Chloride: 103 mmol/L (ref 98–111)
Creatinine, Ser: 1.25 mg/dL — ABNORMAL HIGH (ref 0.61–1.24)
GFR, Estimated: >60 mL/min (ref >60)
Glucose, Bld: 103 mg/dL — ABNORMAL HIGH (ref 70–99)
Potassium: 4 mmol/L (ref 3.5–5.1)
Sodium: 138 mmol/L (ref 135–145)
Total Bilirubin: 0.6 mg/dL (ref 0.3–1.2)
Total Protein: 6.8 g/dL (ref 6.5–8.1)

## 2023-01-05 LAB — CBC WITH DIFFERENTIAL/PLATELET
Abs Immature Granulocytes: 0.01 10*3/uL (ref 0.00–0.07)
Basophils Absolute: 0 10*3/uL (ref 0.0–0.1)
Basophils Relative: 1 %
Eosinophils Absolute: 0.3 10*3/uL (ref 0.0–0.5)
Eosinophils Relative: 10 %
HCT: 37.8 % — ABNORMAL LOW (ref 39.0–52.0)
Hemoglobin: 13.1 g/dL (ref 13.0–17.0)
Immature Granulocytes: 0 %
Lymphocytes Relative: 17 %
Lymphs Abs: 0.5 10*3/uL — ABNORMAL LOW (ref 0.7–4.0)
MCH: 31.8 pg (ref 26.0–34.0)
MCHC: 34.7 g/dL (ref 30.0–36.0)
MCV: 91.7 fL (ref 80.0–100.0)
Monocytes Absolute: 0.4 10*3/uL (ref 0.1–1.0)
Monocytes Relative: 11 %
Neutro Abs: 2 10*3/uL (ref 1.7–7.7)
Neutrophils Relative %: 61 %
Platelets: 147 10*3/uL — ABNORMAL LOW (ref 150–400)
RBC: 4.12 MIL/uL — ABNORMAL LOW (ref 4.22–5.81)
RDW: 12.3 % (ref 11.5–15.5)
WBC: 3.2 10*3/uL — ABNORMAL LOW (ref 4.0–10.5)
nRBC: 0 % (ref 0.0–0.2)

## 2023-01-05 NOTE — Progress Notes (Signed)
[facility] [address] [place] [address]    Clinic Day:  01/05/2023  Referring physician: Kathlee Nations, MD  Patient Care Team: Kathlee Nations, MD as PCP - General (Internal Medicine) Felicita Gage, RN as Registered Nurse (Medical Oncology) Doreatha Massed, MD as Medical Oncologist (Medical Oncology) Therese Sarah, RN as Oncology Nurse Navigator (Medical Oncology)   ASSESSMENT & PLAN:   Assessment:   1.  Stage IV (pT3b pN1 M1) prostate cancer: - Prostate biopsy (04/24/2019): Prostatic adenocarcinoma, Gleason's 4+5=9, done in [place] by Dr. Ian Bushman.  PSA was 6.3. - Radical prostatectomy and lymph node resection (06/27/2019) - Pathology: Prostatic adenocarcinoma, Gleason 5+4=9 (grade group 5).  Extraprostatic extension is present at the right posterior mid, bladder neck and bilateral posterior base, bilateral seminal vesicles invasion present (pT3b).  LVI present.  Margins negative.  Metastatic adenocarcinoma in 1/4 lymph nodes in the right pelvis.  0/5 lymph nodes in the left pelvis.  PT3BPN1. - Postsurgery PSA of 0.2.  10/29/2019 PSA 0.38. - XRT to prostate fossa, pelvic lymph nodes from 12/02/2019 - 01/23/2020 with short-term ADT - PSA initially became undetectable, but quickly began increasing and was noted to have biochemical recurrence in November 2022, PSA 0.46.  PSA increased to 2.04 in February 2023. - PSMA PET scan (07/05/2021): 2 very small presumed foci of nodal tissue in the left middle mediastinum along the descending thoracic aorta concerning for nodal prostate cancer metastasis.  No evidence of local recurrence in the prostatectomy bed or nodal metastasis in the pelvis.  No evidence of visceral or skeletal metastasis. - Germline mutation testing ([facility]) negative on 12/11/2019 - SBRT to the PET positive intrathoracic lymph nodes, 50 Gray in 5 fractions from 08/16/2021 through 08/27/2021. - PSA: 2.26 (10/22/2021), 2.04 (06/08/2021), 0.46 (02/09/2021),  0.17 (11/05/2020), <0.015 (04/22/2020), 0.38 (10/29/2019), 0.20 (09/19/2019). - Total testosterone 427 (11/04/2020) - PSMA PET scan (10/16/2022): New small focus of activity within the central and 1 vertebral body and within the left transverse process of the T11 vertebral body.  No evidence of cancer in the prostate bed or adenopathy. - Status post XRT 50 Gray in 5 fractions completed on 11/22/2022 - PSA increased to 12.8 on 12/15/2022.  He is a candidate for triplet therapy based on Gleason 9 (definition of "high risk" disease as used in LATITUDE trial).  However because of oligometastatic disease, we decided on ADT and abiraterone. - ADT with abiraterone started on 12/15/2022   2.  Social/family history: - He is married and lives with his wife.  He worked with heating and air conditioning.  He had very little exposure to asbestos.  No other chemical exposure. - He quit smoking in 1985. - Mother had breast cancer in her early 84s and died with acute leukemia few months after receiving chemotherapy for breast cancer.  Plan:  1.  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 - He started taking Zytiga on 12/21/2022 with prednisone. - He experienced slightly more fatigue but denied any hot flashes. - Reviewed labs today: Potassium was normal.  Creatinine 1.25, slightly elevated with calcium 8.8.  He has mild leukopenia and thrombocytopenia since 2023 which is stable.  PSA was 12.87 on 12/15/2022. - We discussed option of adding chemotherapy with docetaxel but  decided against it due to oligometastatic disease. - He will continue abiraterone 1000 mg daily and prednisone 5 mg daily.  RTC 6 weeks for follow-up. - He will receive Eligard injection 45 mg on  01/10/2023.   2.  Osteoporosis: - Continue Prolia injections every 6 months with Dr. Orvan Falconer in [place]. - Continue calcium and vitamin D supplements.  Orders Placed This Encounter  Procedures   CBC with Differential     Standing Status:   Future    Standing Expiration Date:   01/05/2024   Comprehensive metabolic panel    Standing Status:   Future    Standing Expiration Date:   01/05/2024   Magnesium    Standing Status:   Future    Standing Expiration Date:   01/05/2024   PSA    Standing Status:   Future    Standing Expiration Date:   01/05/2024      Mikeal Hawthorne R Teague,acting as a scribe for Doreatha Massed, MD.,have documented all relevant documentation on the behalf of Doreatha Massed, MD,as directed by  Doreatha Massed, MD while in the presence of Doreatha Massed, MD.  I, Doreatha Massed MD, have reviewed the above documentation for accuracy and completeness, and I agree with the above.    Doreatha Massed, MD   10/24/202412:58 PM  CHIEF COMPLAINT:   Diagnosis: Prostate cancer metastatic to intrapelvic lymph node    Cancer Staging  No matching staging information was found for the patient.    Prior Therapy:  Radical prostatectomy and BPL ND on 06/27/2019  XRT to prostate fossa, pelvic lymph nodes from 12/02/2019 - 01/23/2020 with short-term ADT  SBRT to the mediastinal lymph nodes, 50 Gray in 5 fractions from 08/16/2021 through 08/27/2021  Current Therapy: ADT and abiraterone/prednisone   HISTORY OF PRESENT ILLNESS:   Oncology History  Prostate cancer metastatic to intrapelvic lymph node (HCC)  08/07/2018 Initial Diagnosis   Prostate cancer metastatic to intrapelvic lymph node (HCC)   12/19/2019 Genetic Testing   Negative genetic testing on the multicancer panel.  The Multi-Gene Panel offered by [facility] includes sequencing and/or deletion duplication testing of the following 85 genes: AIP, ALK, APC, ATM, AXIN2,BAP1,  BARD1, BLM, BMPR1A, BRCA1, BRCA2, BRIP1, CASR, CDC73, CDH1, CDK4, CDKN1B, CDKN1C, CDKN2A (p14ARF), CDKN2A (p16INK4a), CEBPA, CHEK2, CTNNA1, DICER1, DIS3L2, EGFR (c.2369C>T, p.Thr790Met variant only), EPCAM (Deletion/duplication testing only), FH, FLCN,  GATA2, GPC3, GREM1 (Promoter region deletion/duplication testing only), HOXB13 (c.251G>A, p.Gly84Glu), HRAS, KIT, MAX, MEN1, MET, MITF (c.952G>A, p.Glu318Lys variant only), MLH1, MSH2, MSH3, MSH6, MUTYH, NBN, NF1, NF2, NTHL1, PALB2, PDGFRA, PHOX2B, PMS2, POLD1, POLE, POT1, PRKAR1A, PTCH1, PTEN, RAD50, RAD51C, RAD51D, RB1, RECQL4, RET, RNF43, RUNX1, SDHAF2, SDHA (sequence changes only), SDHB, SDHC, SDHD, SMAD4, SMARCA4, SMARCB1, SMARCE1, STK11, SUFU, TERC, TERT, TMEM127, TP53, TSC1, TSC2, VHL, WRN and WT1.  The report date is December 19, 2019.      INTERVAL HISTORY:   Brendan Anderson is a 67 y.o. male presenting to clinic today for follow up of Castration sensitive metastatic prostate cancer. He was last seen by me on 12/15/22.  Today, he states that he is doing well overall. His appetite level is at 90%. His energy level is at 60%. He is accompanied by his wife.   He notes after his Firmagon injection on 12/15/22, he had swelling at the injection site that has almost resolved today. He started abiraterone on 12/21/22 and is taking as prescribed. He is also taking prednisone as prescribed. He reports mildly increased fatigue. He denies any hot flashes.   He c/o shoulder pain, worse on the left shoulder for the past 2 months. Pain is more pronounced when lying down. He notes he had a previous rotator cuff injury on the left shoulder.  PAST MEDICAL HISTORY:   Past Medical History: Past Medical History:  Diagnosis Date   Anxiety    Arthritis    hip knees wrist   Depression    Family history of breast cancer    Family history of prostate cancer    GERD (gastroesophageal reflux disease)    Osteoporosis    Prostate cancer [facility])    Skin cancer     Surgical History: Past Surgical History:  Procedure Laterality Date   BICEPS TENDON REPAIR Left 07/2016   EYE SURGERY Bilateral 2005   LYMPHADENECTOMY Bilateral 06/27/2019   Procedure: LYMPHADENECTOMY, PELVIC;  Surgeon: Heloise Purpura, MD;  Location: [facility]  ORS;  Service: Urology;  Laterality: Bilateral;   MOLE REMOVAL  2008   pre CA from face   NASAL SINUS SURGERY  2010   ROBOT ASSISTED LAPAROSCOPIC RADICAL PROSTATECTOMY N/A 06/27/2019   Procedure: XI ROBOTIC ASSISTED LAPAROSCOPIC RADICAL PROSTATECTOMY LEVEL 2;  Surgeon: Heloise Purpura, MD;  Location: [facility] ORS;  Service: Urology;  Laterality: N/A;   SHOULDER ARTHROSCOPY WITH ROTATOR CUFF REPAIR Left 11/2016    Social History: Social History   Socioeconomic History   Marital status: Married    Spouse name: Kendal Hymen   Number of children: 2   Years of education: Not on file   Highest education level: Not on file  Occupational History   Occupation: retired    Comment: Garment/textile technologist  Tobacco Use   Smoking status: Former    Current packs/day: 0.00    Average packs/day: 0.3 packs/day for 10.0 years (2.5 ttl pk-yrs)    Types: Cigars, Cigarettes    Start date: 06/18/1973    Quit date: 06/19/1983    Years since quitting: 39.5   Smokeless tobacco: Never  Vaping Use   Vaping status: Never Used  Substance and Sexual Activity   Alcohol use: Yes    Comment: rare   Drug use: Never   Sexual activity: Not Currently  Other Topics Concern   Not on file  Social History Narrative   Not on file   Social Determinants of Health   Financial Resource Strain: Not on file  Food Insecurity: No Food Insecurity (11/03/2022)   Hunger Vital Sign    Worried About Running Out of Food in the Last Year: Never true    Ran Out of Food in the Last Year: Never true  Transportation Needs: No Transportation Needs (11/03/2022)   PRAPARE - Administrator, Civil Service (Medical): No    Lack of Transportation (Non-Medical): No  Physical Activity: Not on file  Stress: Not on file  Social Connections: Not on file  Intimate Partner Violence: Not At Risk (11/03/2022)   Humiliation, Afraid, Rape, and Kick questionnaire    Fear of Current or Ex-Partner: No    Emotionally Abused: No    Physically  Abused: No    Sexually Abused: No    Family History: Family History  Problem Relation Age of Onset   Leukemia Mother    Breast cancer Mother 14   Cancer Father        NOS   Breast cancer Paternal Aunt        dx in her 16s   Brain cancer Paternal Uncle        d. 15s   Heart attack Maternal Grandmother    Stroke Maternal Grandfather    Asthma Paternal Grandmother    Prostate cancer Paternal Grandfather    Cancer Nephew 10  brother's grandson - NOS   Colon cancer Neg Hx    Pancreatic cancer Neg Hx     Current Medications:  Current Outpatient Medications:    abiraterone acetate (ZYTIGA) 250 MG tablet, Take 4 tablets (1,000 mg total) by mouth daily. Take on an empty stomach 1 hour before or 2 hours after a meal, Disp: 120 tablet, Rfl: 3   Al Hyd-Mg Tr-Alg Ac-Sod Bicarb (GAVISCON-2 PO), Take 2 tablets by mouth at bedtime., Disp: , Rfl:    ascorbic acid (VITAMIN C) 250 MG tablet, Take 500 mg by mouth daily at 12 noon., Disp: , Rfl:    Calcium Carbonate Antacid 400 MG CHEW, Chew 800 mg by mouth 2 (two) times daily., Disp: , Rfl:    celecoxib (CELEBREX) 200 MG capsule, Take 200 mg by mouth daily., Disp: , Rfl:    cetirizine (ZYRTEC) 10 MG tablet, Take 10 mg by mouth daily as needed for allergies., Disp: , Rfl:    denosumab (PROLIA) 60 MG/ML SOSY injection, Inject 60 mg into the skin every 6 (six) months., Disp: , Rfl:    esomeprazole (NEXIUM) 40 MG capsule, Take 40 mg by mouth daily., Disp: , Rfl:    Multiple Vitamin (MULTIVITAMIN) tablet, Take 1 tablet by mouth daily., Disp: , Rfl:    Multiple Vitamins-Minerals (MULTIVITAMIN ADULT, MINERALS,) TABS, Take 1 tablet by mouth daily., Disp: , Rfl:    pantoprazole (PROTONIX) 40 MG tablet, Take 40 mg by mouth daily., Disp: , Rfl:    predniSONE (DELTASONE) 5 MG tablet, Take 1 tablet (5 mg total) by mouth daily with breakfast., Disp: 30 tablet, Rfl: 6   sildenafil (VIAGRA) 100 MG tablet, Take 25 mg by mouth daily as needed for erectile  dysfunction., Disp: , Rfl:    tizanidine (ZANAFLEX) 2 MG capsule, Take 2 mg by mouth daily as needed for muscle spasms., Disp: , Rfl:    traZODone (DESYREL) 100 MG tablet, Take 100 mg by mouth at bedtime., Disp: , Rfl:    Vitamin D, Ergocalciferol, (DRISDOL) 1.25 MG (50000 UNIT) CAPS capsule, Take 50,000 Units by mouth once a week. Wednesday, Disp: , Rfl:    Allergies: Allergies  Allergen Reactions   Codeine Other (See Comments)    "MAKES HIM HYPER"    REVIEW OF SYSTEMS:   Review of Systems  Constitutional:  Positive for fatigue. Negative for chills and fever.  HENT:   Negative for lump/mass, mouth sores, nosebleeds, sore throat and trouble swallowing.   Eyes:  Negative for eye problems.  Respiratory:  Positive for shortness of breath. Negative for cough.   Cardiovascular:  Negative for chest pain, leg swelling and palpitations.  Gastrointestinal:  Positive for nausea. Negative for abdominal pain, constipation, diarrhea and vomiting.  Genitourinary:  Negative for bladder incontinence, difficulty urinating, dysuria, frequency, hematuria and nocturia.   Musculoskeletal:  Positive for back pain (5/10 severity). Negative for arthralgias, flank pain, myalgias and neck pain.  Skin:  Negative for itching and rash.  Neurological:  Positive for headaches. Negative for dizziness and numbness.  Hematological:  Does not bruise/bleed easily.  Psychiatric/Behavioral:  Negative for depression, sleep disturbance and suicidal ideas. The patient is not nervous/anxious.   All other systems reviewed and are negative.    VITALS:   Blood pressure 123/74, pulse 79, temperature 97.9 F (36.6 C), temperature source Oral, resp. rate 18, height 5\' 8"  (1.727 m), weight 187 lb 12.8 oz (85.2 kg), SpO2 98%.  Wt Readings from Last 3 Encounters:  01/05/23 187 lb 12.8 oz (  85.2 kg)  12/15/22 187 lb 12.8 oz (85.2 kg)  11/03/22 189 lb 2 oz (85.8 kg)    Body mass index is 28.55 kg/m.  Performance status (ECOG):  1 - Symptomatic but completely ambulatory  PHYSICAL EXAM:   Physical Exam Vitals and nursing note reviewed. Exam conducted with a chaperone present.  Constitutional:      Appearance: Normal appearance.  Cardiovascular:     Rate and Rhythm: Normal rate and regular rhythm.     Pulses: Normal pulses.     Heart sounds: Normal heart sounds.  Pulmonary:     Effort: Pulmonary effort is normal.     Breath sounds: Normal breath sounds.  Abdominal:     Palpations: Abdomen is soft. There is no hepatomegaly, splenomegaly or mass.     Tenderness: There is no abdominal tenderness.  Musculoskeletal:     Right lower leg: No edema.     Left lower leg: No edema.  Lymphadenopathy:     Cervical: No cervical adenopathy.     Right cervical: No superficial, deep or posterior cervical adenopathy.    Left cervical: No superficial, deep or posterior cervical adenopathy.     Upper Body:     Right upper body: No supraclavicular or axillary adenopathy.     Left upper body: No supraclavicular or axillary adenopathy.  Neurological:     General: No focal deficit present.     Mental Status: He is alert and oriented to person, place, and time.  Psychiatric:        Mood and Affect: Mood normal.        Behavior: Behavior normal.     LABS:      Latest Ref Rng & Units 01/05/2023   10:44 AM 09/05/2021    9:45 PM 06/28/2019   10:49 AM  CBC  WBC 4.0 - 10.5 K/uL 3.2  3.7    Hemoglobin 13.0 - 17.0 g/dL 32.9  51.8  84.1   Hematocrit 39.0 - 52.0 % 37.8  41.7  33.8   Platelets 150 - 400 K/uL 147  124        Latest Ref Rng & Units 01/05/2023   10:44 AM 09/05/2021   11:45 PM 09/05/2021    9:45 PM  CMP  Glucose 70 - 99 mg/dL 660   630   BUN 8 - 23 mg/dL 24   19   Creatinine 1.60 - 1.24 mg/dL 1.09   3.23   Sodium 557 - 145 mmol/L 138   138   Potassium 3.5 - 5.1 mmol/L 4.0   3.9   Chloride 98 - 111 mmol/L 103   104   CO2 22 - 32 mmol/L 26   24   Calcium 8.9 - 10.3 mg/dL 8.8   8.9   Total Protein 6.5 - 8.1  g/dL 6.8  6.9    Total Bilirubin 0.3 - 1.2 mg/dL 0.6  0.8    Alkaline Phos 38 - 126 U/L 52  53    AST 15 - 41 U/L 17  14    ALT 0 - 44 U/L 18  15       No results found for: "CEA1", "CEA" / No results found for: "CEA1", "CEA" No results found for: "PSA1" No results found for: "DUK025" No results found for: "CAN125"  No results found for: "TOTALPROTELP", "ALBUMINELP", "A1GS", "A2GS", "BETS", "BETA2SER", "GAMS", "MSPIKE", "SPEI" No results found for: "TIBC", "FERRITIN", "IRONPCTSAT" No results found for: "LDH"   STUDIES:   No results found.

## 2023-01-05 NOTE — Patient Instructions (Signed)
[facility] at [facility] Discharge Instructions   You were seen and examined today by Dr. Ellin Saba.  He reviewed the results of your lab work which are normal/stable.   Continue Zytiga as prescribed.   We will schedule you for an injection called Eligard next week. This is given every 6 months.  Return as scheduled.    Thank you for choosing [facility] at [facility] to provide your oncology and hematology care.  To afford each patient quality time with our provider, please arrive at least 15 minutes before your scheduled appointment time.   If you have a lab appointment with the Cancer Center please come in thru the Main Entrance and check in at the main information desk.  You need to re-schedule your appointment should you arrive 10 or more minutes late.  We strive to give you quality time with our providers, and arriving late affects you and other patients whose appointments are after yours.  Also, if you no show three or more times for appointments you may be dismissed from the clinic at the providers discretion.     Again, thank you for choosing [facility].  Our hope is that these requests will decrease the amount of time that you wait before being seen by our physicians.       _____________________________________________________________  Should you have questions after your visit to [facility], please contact our office at [phone] and follow the prompts.  Our office hours are 8:00 a.m. and 4:30 p.m. Monday - Friday.  Please note that voicemails left after 4:00 p.m. may not be returned until the following business day.  We are closed weekends and major holidays.  You do have access to a nurse 24-7, just call the main number to the clinic [phone] and do not press any options, hold on the line and a nurse will answer the phone.    For prescription refill requests, have your pharmacy contact our  office and allow 72 hours.    Due to Covid, you will need to wear a mask upon entering the hospital. If you do not have a mask, a mask will be given to you at the Main Entrance upon arrival. For doctor visits, patients may have 1 support person age 61 or older with them. For treatment visits, patients can not have anyone with them due to social distancing guidelines and our immunocompromised population.

## 2023-01-05 NOTE — Progress Notes (Signed)
Patient is taking Zytiga as prescribed.  He has not missed any doses and reports no side effects at this time.   

## 2023-01-10 ENCOUNTER — Inpatient Hospital Stay: Payer: Medicare Other

## 2023-01-10 ENCOUNTER — Other Ambulatory Visit: Payer: Self-pay

## 2023-01-10 VITALS — BP 119/74 | HR 79 | Temp 98.2°F | Resp 18

## 2023-01-10 DIAGNOSIS — C61 Malignant neoplasm of prostate: Secondary | ICD-10-CM | POA: Diagnosis not present

## 2023-01-10 MED ORDER — LEUPROLIDE ACETATE (6 MONTH) 45 MG ~~LOC~~ KIT
45.0000 mg | PACK | Freq: Once | SUBCUTANEOUS | Status: AC
Start: 2023-01-10 — End: 2023-01-10
  Administered 2023-01-10: 45 mg via SUBCUTANEOUS
  Filled 2023-01-10: qty 45

## 2023-01-10 NOTE — Patient Instructions (Signed)
[facility]  Discharge Instructions: Thank you for choosing [facility] to provide your oncology and hematology care.  If you have a lab appointment with the Cancer Center - please note that after April 8th, 2024, all labs will be drawn in the cancer center.  You do not have to check in or register with the main entrance as you have in the past but will complete your check-in in the cancer center.  Wear comfortable clothing and clothing appropriate for easy access to any Portacath or PICC line.   We strive to give you quality time with your provider. You may need to reschedule your appointment if you arrive late (15 or more minutes).  Arriving late affects you and other patients whose appointments are after yours.  Also, if you miss three or more appointments without notifying the office, you may be dismissed from the clinic at the provider's discretion.      For prescription refill requests, have your pharmacy contact our office and allow 72 hours for refills to be completed.    Today you received the following chemotherapy and/or immunotherapy agents Eligard.  Leuprolide Suspension for Injection (Prostate Cancer) What is this medication? LEUPROLIDE (loo PROE lide) reduces the symptoms of prostate cancer. It works by decreasing levels of the hormone testosterone in the body. This prevents prostate cancer cells from spreading or growing. This medicine may be used for other purposes; ask your health care provider or pharmacist if you have questions. COMMON BRAND NAME(S): Eligard, Lupron Depot, Lutrate Depot What should I tell my care team before I take this medication? They need to know if you have any of these conditions: Diabetes Heart disease Heart failure High or low levels of electrolytes, such as magnesium, potassium, or sodium in your blood Irregular heartbeat or rhythm Seizures An unusual or allergic reaction to leuprolide, other medications, foods,  dyes, or preservatives Pregnant or trying to get pregnant Breast-feeding How should I use this medication? This medication is injected under the skin or into a muscle. It is given by your care team in a hospital or clinic setting. Talk to your care team about the use of this medication in children. Special care may be needed. Overdosage: If you think you have taken too much of this medicine contact a poison control center or emergency room at once. NOTE: This medicine is only for you. Do not share this medicine with others. What if I miss a dose? Keep appointments for follow-up doses. It is important not to miss your dose. Call your care team if you are unable to keep an appointment. What may interact with this medication? Do not take this medication with any of the following: Cisapride Dronedarone Ketoconazole Levoketoconazole Pimozide Thioridazine This medication may also interact with the following: Other medications that cause heart rhythm changes This list may not describe all possible interactions. Give your health care provider a list of all the medicines, herbs, non-prescription drugs, or dietary supplements you use. Also tell them if you smoke, drink alcohol, or use illegal drugs. Some items may interact with your medicine. What should I watch for while using this medication? Visit your care team for regular checks on your progress. Tell your care team if your symptoms do not start to get better or if they get worse. This medication may increase blood sugar. The risk may be higher in patients who already have diabetes. Ask your care team what you can do to lower the risk of  diabetes while taking this medication. This medication may cause infertility. Talk to your care team if you are concerned about your fertility. Heart attacks and strokes have been reported with the use of this medication. Get emergency help if you develop signs or symptoms of a heart attack or stroke. Talk to  your care team about the risks and benefits of this medication. What side effects may I notice from receiving this medication? Side effects that you should report to your care team as soon as possible: Allergic reactions--skin rash, itching, hives, swelling of the face, lips, tongue, or throat Heart attack--pain or tightness in the chest, shoulders, arms, or jaw, nausea, shortness of breath, cold or clammy skin, feeling faint or lightheaded Heart rhythm changes--fast or irregular heartbeat, dizziness, feeling faint or lightheaded, chest pain, trouble breathing High blood sugar (hyperglycemia)--increased thirst or amount of urine, unusual weakness or fatigue, blurry vision New or worsening seizures Redness, blistering, peeling, or loosening of the skin, including inside the mouth Stroke--sudden numbness or weakness of the face, arm, or leg, trouble speaking, confusion, trouble walking, loss of balance or coordination, dizziness, severe headache, change in vision Swelling and pain of the tumor site or lymph nodes Side effects that usually do not require medical attention (report these to your care team if they continue or are bothersome): Change in sex drive or performance Hot flashes Joint pain Pain, redness, or irritation at injection site Swelling of the ankles, hands, or feet Unusual weakness or fatigue This list may not describe all possible side effects. Call your doctor for medical advice about side effects. You may report side effects to FDA at 1-800-FDA-1088. Where should I keep my medication? This medication is given in a hospital or clinic. It will not be stored at home. NOTE: This sheet is a summary. It may not cover all possible information. If you have questions about this medicine, talk to your doctor, pharmacist, or health care provider.  2024 Elsevier/Gold Standard (2022-07-01 00:00:00)        To help prevent nausea and vomiting after your treatment, we encourage you to take  your nausea medication as directed.  BELOW ARE SYMPTOMS THAT SHOULD BE REPORTED IMMEDIATELY: *FEVER GREATER THAN 100.4 F (38 C) OR HIGHER *CHILLS OR SWEATING *NAUSEA AND VOMITING THAT IS NOT CONTROLLED WITH YOUR NAUSEA MEDICATION *UNUSUAL SHORTNESS OF BREATH *UNUSUAL BRUISING OR BLEEDING *URINARY PROBLEMS (pain or burning when urinating, or frequent urination) *BOWEL PROBLEMS (unusual diarrhea, constipation, pain near the anus) TENDERNESS IN MOUTH AND THROAT WITH OR WITHOUT PRESENCE OF ULCERS (sore throat, sores in mouth, or a toothache) UNUSUAL RASH, SWELLING OR PAIN  UNUSUAL VAGINAL DISCHARGE OR ITCHING   Items with * indicate a potential emergency and should be followed up as soon as possible or go to the Emergency Department if any problems should occur.  Please show the CHEMOTHERAPY ALERT CARD or IMMUNOTHERAPY ALERT CARD at check-in to the Emergency Department and triage nurse.  Should you have questions after your visit or need to cancel or reschedule your appointment, please contact [facility] [phone]  and follow the prompts.  Office hours are 8:00 a.m. to 4:30 p.m. Monday - Friday. Please note that voicemails left after 4:00 p.m. may not be returned until the following business day.  We are closed weekends and major holidays. You have access to a nurse at all times for urgent questions. Please call the main number to the clinic [phone] and follow the prompts.  For any non-urgent questions,  you may also contact your provider using MyChart. We now offer e-Visits for anyone 52 and older to request care online for non-urgent symptoms. For details visit [email or web address].   Also download the MyChart app! Go to the app store, search "MyChart", open the app, select [place], and log in with your MyChart username and password.

## 2023-01-10 NOTE — Progress Notes (Signed)
Brendan Anderson presents today for injection per the provider's orders.  Eligard administration without incident; injection site WNL; see MAR for injection details.  Patient tolerated procedure well and without incident.  No questions or complaints noted at this time. Discharged from clinic ambulatory in stable condition. Alert and oriented x 3. F/U with [facility] as scheduled.

## 2023-01-12 ENCOUNTER — Other Ambulatory Visit (HOSPITAL_COMMUNITY): Payer: Self-pay

## 2023-01-12 ENCOUNTER — Encounter: Payer: Self-pay | Admitting: Hematology

## 2023-01-12 ENCOUNTER — Other Ambulatory Visit: Payer: Self-pay

## 2023-01-12 NOTE — Progress Notes (Signed)
Specialty Pharmacy Refill Coordination Note  Brendan Anderson is a 67 y.o. male contacted today regarding refills of specialty medication(s) Abiraterone Acetate   Patient requested Delivery   Delivery date: 01/17/23   Verified address: [address] [place] [address]   Medication will be filled on 01/16/23.

## 2023-01-12 NOTE — Progress Notes (Signed)
Specialty Pharmacy Ongoing Clinical Assessment Note  Brendan Anderson is a 67 y.o. male who is being followed by the specialty pharmacy service for RxSp Oncology   Patient's specialty medication(s) reviewed today: Abiraterone Acetate   Missed doses in the last 4 weeks: 0   Patient/Caregiver did not have any additional questions or concerns.   Therapeutic benefit summary: Patient is achieving benefit   Adverse events/side effects summary: Experienced adverse events/side effects (fatigue, tolerable)   Patient's therapy is appropriate to: Continue    Goals Addressed             This Visit's Progress    Slow Disease Progression       Patient is initiating therapy. Patient will maintain adherence.  Most recent PSA was 12.87 ng/mL.          Follow up:  3 months  Servando Snare Specialty Pharmacist

## 2023-01-16 ENCOUNTER — Encounter: Payer: Self-pay | Admitting: Hematology

## 2023-01-16 ENCOUNTER — Other Ambulatory Visit: Payer: Self-pay

## 2023-01-17 ENCOUNTER — Ambulatory Visit
Admission: RE | Admit: 2023-01-17 | Discharge: 2023-01-17 | Disposition: A | Discharge status: Home / Self Care | Payer: Medicare Other | Source: Ambulatory Visit | Attending: Radiation Oncology | Admitting: Radiation Oncology

## 2023-01-17 NOTE — Progress Notes (Signed)
  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   Symptoms of fatigue have improved since completing therapy.  Symptoms of bladder changes have improved since completing therapy. Current symptoms include none, and medications for bladder symptoms include none.  Symptoms of bowel changes have improved since completing therapy. Current symptoms include none, and medications for bowel symptoms include none.   Post Treatment IPSS Score: IPSS Questionnaire (AUA-7): Over the past month.   1)  How often have you had a sensation of not emptying your bladder completely after you finish urinating?  0 - Not at all  2)  How often have you had to urinate again less than two hours after you finished urinating? 0 - Not at all  3)  How often have you found you stopped and started again several times when you urinated?  0 - Not at all  4) How difficult have you found it to postpone urination?  0 - Not at all  5) How often have you had a weak urinary stream?  0 - Not at all  6) How often have you had to push or strain to begin urination?  0 - Not at all  7) How many times did you most typically get up to urinate from the time you went to bed until the time you got up in the morning?  2 - 2 times  Total score:  2. Which indicates mild symptoms  0-7 mildly symptomatic   8-19 moderately symptomatic   20-35 severely symptomatic    Patient has a scheduled follow up visit with his urologist, Dr. Laverle Patter, on 02/2023 for ongoing surveillance. He was counseled that PSA levels will be drawn in the urology office, and was reassured that  additional time is expected to improve bowel and bladder symptoms. He was encouraged to call back with concerns or questions regarding radiation.  This concludes the interaction.  Ruel Favors, LPN

## 2023-01-20 ENCOUNTER — Telehealth: Payer: Self-pay

## 2023-01-20 ENCOUNTER — Emergency Department (HOSPITAL_COMMUNITY)
Admission: EM | Admit: 2023-01-20 | Discharge: 2023-01-20 | Disposition: A | Discharge status: Home / Self Care | Payer: Medicare Other | Attending: Student | Admitting: Student

## 2023-01-20 ENCOUNTER — Other Ambulatory Visit: Payer: Self-pay

## 2023-01-20 ENCOUNTER — Emergency Department (HOSPITAL_COMMUNITY): Payer: Medicare Other

## 2023-01-20 ENCOUNTER — Encounter (HOSPITAL_COMMUNITY): Payer: Self-pay

## 2023-01-20 DIAGNOSIS — Z79899 Other long term (current) drug therapy: Secondary | ICD-10-CM | POA: Insufficient documentation

## 2023-01-20 DIAGNOSIS — R519 Headache, unspecified: Secondary | ICD-10-CM | POA: Diagnosis present

## 2023-01-20 DIAGNOSIS — R42 Dizziness and giddiness: Secondary | ICD-10-CM | POA: Insufficient documentation

## 2023-01-20 DIAGNOSIS — Z8546 Personal history of malignant neoplasm of prostate: Secondary | ICD-10-CM | POA: Insufficient documentation

## 2023-01-20 LAB — COMPREHENSIVE METABOLIC PANEL
ALT: 23 U/L (ref 0–44)
AST: 23 U/L (ref 15–41)
Albumin: 3.7 g/dL (ref 3.5–5.0)
Alkaline Phosphatase: 45 U/L (ref 38–126)
Anion gap: 7 (ref 5–15)
BUN: 16 mg/dL (ref 8–23)
CO2: 23 mmol/L (ref 22–32)
Calcium: 8.4 mg/dL — ABNORMAL LOW (ref 8.9–10.3)
Chloride: 108 mmol/L (ref 98–111)
Creatinine, Ser: 1.02 mg/dL (ref 0.61–1.24)
GFR, Estimated: >60 mL/min (ref >60)
Glucose, Bld: 114 mg/dL — ABNORMAL HIGH (ref 70–99)
Potassium: 4.1 mmol/L (ref 3.5–5.1)
Sodium: 138 mmol/L (ref 135–145)
Total Bilirubin: 0.6 mg/dL (ref <1.2)
Total Protein: 6.4 g/dL — ABNORMAL LOW (ref 6.5–8.1)

## 2023-01-20 LAB — CBC WITH DIFFERENTIAL/PLATELET
Abs Immature Granulocytes: 0.01 10*3/uL (ref 0.00–0.07)
Basophils Absolute: 0 10*3/uL (ref 0.0–0.1)
Basophils Relative: 1 %
Eosinophils Absolute: 0.4 10*3/uL (ref 0.0–0.5)
Eosinophils Relative: 12 %
HCT: 37.6 % — ABNORMAL LOW (ref 39.0–52.0)
Hemoglobin: 13 g/dL (ref 13.0–17.0)
Immature Granulocytes: 0 %
Lymphocytes Relative: 18 %
Lymphs Abs: 0.6 10*3/uL — ABNORMAL LOW (ref 0.7–4.0)
MCH: 32.1 pg (ref 26.0–34.0)
MCHC: 34.6 g/dL (ref 30.0–36.0)
MCV: 92.8 fL (ref 80.0–100.0)
Monocytes Absolute: 0.3 10*3/uL (ref 0.1–1.0)
Monocytes Relative: 8 %
Neutro Abs: 2 10*3/uL (ref 1.7–7.7)
Neutrophils Relative %: 61 %
Platelets: 144 10*3/uL — ABNORMAL LOW (ref 150–400)
RBC: 4.05 MIL/uL — ABNORMAL LOW (ref 4.22–5.81)
RDW: 12.6 % (ref 11.5–15.5)
WBC: 3.3 10*3/uL — ABNORMAL LOW (ref 4.0–10.5)
nRBC: 0 % (ref 0.0–0.2)

## 2023-01-20 LAB — RAPID URINE DRUG SCREEN, HOSP PERFORMED
Amphetamines: NOT DETECTED
Barbiturates: NOT DETECTED
Benzodiazepines: NOT DETECTED
Cocaine: NOT DETECTED
Opiates: NOT DETECTED
Tetrahydrocannabinol: NOT DETECTED

## 2023-01-20 LAB — URINALYSIS, ROUTINE W REFLEX MICROSCOPIC
Bilirubin Urine: NEGATIVE
Glucose, UA: NEGATIVE mg/dL
Hgb urine dipstick: NEGATIVE
Ketones, ur: NEGATIVE mg/dL
Leukocytes,Ua: NEGATIVE
Nitrite: NEGATIVE
Protein, ur: NEGATIVE mg/dL
Specific Gravity, Urine: 1.019 (ref 1.005–1.030)
pH: 7 (ref 5.0–8.0)

## 2023-01-20 LAB — CBG MONITORING, ED: Glucose-Capillary: 119 mg/dL — ABNORMAL HIGH (ref 70–99)

## 2023-01-20 MED ORDER — KETOROLAC TROMETHAMINE 15 MG/ML IJ SOLN
15.0000 mg | Freq: Once | INTRAMUSCULAR | Status: AC
  Administered 2023-01-20: 15 mg via INTRAVENOUS
  Filled 2023-01-20: qty 1

## 2023-01-20 MED ORDER — SODIUM CHLORIDE 0.9 % IV SOLN: INTRAVENOUS | Status: DC

## 2023-01-20 MED ORDER — SODIUM CHLORIDE 0.9 % IV BOLUS
1000.0000 mL | Freq: Once | INTRAVENOUS | Status: AC
Start: 2023-01-20 — End: 2023-01-20
  Administered 2023-01-20: 1000 mL via INTRAVENOUS

## 2023-01-20 MED ORDER — ONDANSETRON HCL 4 MG/2ML IJ SOLN
4.0000 mg | Freq: Once | INTRAMUSCULAR | Status: AC
  Administered 2023-01-20: 4 mg via INTRAVENOUS
  Filled 2023-01-20: qty 2

## 2023-01-20 MED ORDER — MECLIZINE HCL 12.5 MG PO TABS
12.5000 mg | ORAL_TABLET | Freq: Once | ORAL | Status: AC
  Administered 2023-01-20: 12.5 mg via ORAL
  Filled 2023-01-20: qty 1

## 2023-01-20 MED ORDER — MECLIZINE HCL 12.5 MG PO TABS: 12.5000 mg | ORAL_TABLET | Freq: Three times a day (TID) | ORAL | 0 refills | Status: AC | PRN

## 2023-01-20 NOTE — ED Notes (Signed)
Pt able to walk back to ED room 9 Requested to use restroom prior to getting attached to monitor

## 2023-01-20 NOTE — ED Provider Triage Note (Signed)
Emergency Medicine Provider Triage Evaluation Note  Brendan Anderson , a 67 y.o. male  was evaluated in triage.  Pt complains of headache, transient alteration of awareness, dizziness.  Patient has stage IV prostate cancer on oral chemotherapy and prednisone.  Was driving yesterday when he had sudden onset headache and transient alteration of awareness.  Went to sleep, woke up with unsteadiness on his feet and slow to answer questions.  Has persistent headache today but no numbness, tingling, weakness or other neurologic complaints.  Review of Systems  Positive: Headache, dizziness, confusion Negative: Numbness, tingling, weakness, nausea, vomiting  Physical Exam  BP 138/85 (BP Location: Right Arm)   Pulse 70   Temp (!) 97.5 F (36.4 C)   Resp 16   Ht 5\' 8"  (1.727 m)   Wt 81.6 kg   SpO2 98%   BMI 27.37 kg/m  Gen:   Awake, no distress   Resp:  Normal effort  MSK:   Moves extremities without difficulty  Other:  Neurologic exam unremarkable  Medical Decision Making  Medically screening exam initiated at 11:55 AM.  Appropriate orders placed.  Brendan Anderson was informed that the remainder of the evaluation will be completed by another provider, this initial triage assessment does not replace that evaluation, and the importance of remaining in the ED until their evaluation is complete.     Brendan Score, MD 01/20/23 1155

## 2023-01-20 NOTE — Telephone Encounter (Signed)
Patient's wife called stating that last night the patient suddenly experienced a "terrible headache" as well as disorientation. She reports that this morning the patient continues to have a headache and he continues to complain that he "does not feel right in the head." Patient's wife is requesting an appointment with Dr. Ellin Saba. I have told the wife to take the patient directly to the emergency department.

## 2023-01-20 NOTE — ED Notes (Signed)
Patient transported to MRI 

## 2023-01-20 NOTE — ED Triage Notes (Signed)
Headache and dizziness started at 18:30 last night  Confused last night could not find his way home Denies blurred vision   Dr Posey Rea informed of pt's arrival  118 BGL in triage

## 2023-01-20 NOTE — ED Provider Notes (Signed)
Brendan Anderson Provider Note   CSN: [id number] Arrival date & time: 01/20/23  1117     History  Chief Complaint  Patient presents with   Headache   HPIO Brendan Anderson is a 67 y.o. male with history of stage IV prostate cancer presenting for headache.  Started all of a sudden last night.  Wife also mention that he was disoriented was driving down the road did not know how to get back to his home.  Denies visual disturbance but also states he was dizzy.  The dizziness is more like a loss of balance sensation.  States if he walks down the hall he would be afraid he might fall.  Denies nausea vomiting diarrhea.  Denies trauma to the head.  Also mention he has had a lot of stress in his life and is seeking treatment for that as well.  Currently taking Zytiga.   Headache      Home Medications Prior to Admission medications   Medication Sig Start Date End Date Taking? Authorizing Provider  abiraterone acetate (ZYTIGA) 250 MG tablet Take 4 tablets (1,000 mg total) by mouth daily. Take on an empty stomach 1 hour before or 2 hours after a meal 12/15/22  Yes Doreatha Massed, MD  Al Hyd-Mg Tr-Alg Ac-Sod Bicarb (GAVISCON-2 PO) Take 2 tablets by mouth at bedtime.   Yes [provider]  ascorbic acid (VITAMIN C) 250 MG tablet Take 500 mg by mouth daily at 12 noon.   Yes [provider]  Calcium Carbonate Antacid 400 MG CHEW Chew 800 mg by mouth 2 (two) times daily.   Yes [provider]  celecoxib (CELEBREX) 200 MG capsule Take 200 mg by mouth daily.   Yes [provider]  cetirizine (ZYRTEC) 10 MG tablet Take 10 mg by mouth daily as needed for allergies.   Yes [provider]  denosumab (PROLIA) 60 MG/ML SOSY injection Inject 60 mg into the skin every 6 (six) months.   Yes [provider]  Multiple Vitamin (MULTIVITAMIN) tablet Take 1 tablet by mouth daily.   Yes [provider]   Multiple Vitamins-Minerals (MULTIVITAMIN ADULT, MINERALS,) TABS Take 1 tablet by mouth daily.   Yes [provider]  pantoprazole (PROTONIX) 40 MG tablet Take 40 mg by mouth daily. 12/23/22  Yes [provider]  predniSONE (DELTASONE) 5 MG tablet Take 1 tablet (5 mg total) by mouth daily with breakfast. 12/15/22  Yes Doreatha Massed, MD  sildenafil (VIAGRA) 100 MG tablet Take 25 mg by mouth daily as needed for erectile dysfunction. 08/03/18  Yes [provider]  tizanidine (ZANAFLEX) 2 MG capsule Take 2 mg by mouth daily as needed for muscle spasms.   Yes [provider]  traZODone (DESYREL) 100 MG tablet Take 100 mg by mouth at bedtime. 05/26/19  Yes [provider]  Vitamin D, Ergocalciferol, (DRISDOL) 1.25 MG (50000 UNIT) CAPS capsule Take 50,000 Units by mouth once a week. Wednesday 07/01/21  Yes [provider]  esomeprazole (NEXIUM) 40 MG capsule Take 40 mg by mouth daily. Patient not taking: Reported on 01/20/2023 09/09/19   [provider]      Allergies    Codeine    Review of Systems   Review of Systems  Neurological:  Positive for headaches.    Physical Exam   Vitals:   01/20/23 1733 01/20/23 1900  BP: (!) 140/83 (!) 149/73  Pulse: 63 62  Resp: 18 17  Temp: 98  F (36.7 C) 98 F (36.7 C)  SpO2: 97% 97%    CONSTITUTIONAL:  well-appearing, NAD NEURO:  GCS 15. Speech is goal oriented. No deficits appreciated to CN III-XII; symmetric eyebrow raise, no facial drooping, tongue midline. Patient has equal grip strength bilaterally with 5/5 strength against resistance in all major muscle groups bilaterally. Sensation to light touch intact. Patient moves extremities without ataxia. Normal finger-nose-finger. Patient ambulatory with steady gait but stated "if I keep walking on going to fall". EYES:  eyes equal and reactive ENT/NECK:  Supple, no stridor CARDIO: regular rate and rhythm, appears well-perfused  PULM:  No  respiratory distress, CTAB GI/GU:  non-distended, soft MSK/SPINE:  No gross deformities, no edema, moves all extremities  SKIN:  no rash, atraumatic  *Additional and/or pertinent findings included in MDM below  ED Results / Procedures / Treatments   Labs (all labs ordered are listed, but only abnormal results are displayed) Labs Reviewed  CBC WITH DIFFERENTIAL/PLATELET - Abnormal; Notable for the following components:      Result Value   WBC 3.3 (*)    RBC 4.05 (*)    HCT 37.6 (*)    Platelets 144 (*)    Lymphs Abs 0.6 (*)    All other components within normal limits  COMPREHENSIVE METABOLIC PANEL - Abnormal; Notable for the following components:   Glucose, Bld 114 (*)    Calcium 8.4 (*)    Total Protein 6.4 (*)    All other components within normal limits  URINALYSIS, ROUTINE W REFLEX MICROSCOPIC - Abnormal; Notable for the following components:   APPearance HAZY (*)    All other components within normal limits  CBG MONITORING, ED - Abnormal; Notable for the following components:   Glucose-Capillary 119 (*)    All other components within normal limits  RAPID URINE DRUG SCREEN, HOSP PERFORMED  CBG MONITORING, ED    EKG None  Radiology MR BRAIN WO CONTRAST  Result Date: 01/20/2023 CLINICAL DATA:  Sudden onset headache and transient alteration awareness, unsteadiness on his feet, persistent headache EXAM: MRI HEAD WITHOUT CONTRAST TECHNIQUE: Multiplanar, multiecho pulse sequences of the brain and surrounding structures were obtained without intravenous contrast. COMPARISON:  No prior MRI available, correlation is made with 01/20/2023 CT head FINDINGS: Brain: No restricted diffusion to suggest acute or subacute infarct. No acute hemorrhage, mass, mass effect, or midline shift. No hydrocephalus or extra-axial collection. Pituitary and craniocervical junction within normal limits. No hemosiderin deposition to suggest remote hemorrhage. 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 Scattered and confluent T2 hyperintense signal in the periventricular white matter, likely the sequela of mild-to-moderate chronic small vessel ischemic disease. Vascular: Normal arterial flow voids. Skull and upper cervical spine: Normal marrow signal. Sinuses/Orbits: Mucosal thickening in inferior right frontal sinus. Small mucous retention cyst in the right nasal cavity. No acute finding in the orbits. Status post bilateral lens replacements. Other: The mastoid air cells are well aerated. IMPRESSION: No acute intracranial process. No evidence of acute or subacute infarct. Electronically Signed   By: Wiliam Ke M.D.   On: 01/20/2023 19:02   CT Head Wo Contrast  Result Date: 01/20/2023 CLINICAL DATA:  Headache, increasing frequency or severity EXAM: CT HEAD WITHOUT CONTRAST TECHNIQUE: Contiguous axial images were obtained from the base of the skull through the vertex without intravenous contrast. RADIATION DOSE REDUCTION: This exam was performed according to the departmental dose-optimization program which includes automated exposure control, adjustment of the mA and/or kV according to patient size and/or  use of iterative reconstruction technique. COMPARISON:  None Available. FINDINGS: Brain: No evidence of acute infarction, hemorrhage, hydrocephalus, extra-axial collection or mass lesion/mass effect. Vascular: No hyperdense vessel or unexpected calcification. Skull: Normal. Negative for fracture or focal lesion. Sinuses/Orbits: No middle ear or mastoid effusion. Paranasal sinuses are notable for postsurgical changes from prior FESS. Bilateral lens replacement. Orbits are otherwise unremarkable. Other: None. IMPRESSION: No CT etiology for headaches identified Electronically Signed   By: Lorenza Cambridge M.D.   On: 01/20/2023 15:08    Procedures Procedures    Medications Ordered in ED Medications  meclizine (ANTIVERT) tablet 12.5 mg (12.5 mg Oral Given 01/20/23 1454)  sodium chloride 0.9 % bolus  1,000 mL (1,000 mLs Intravenous Bolus 01/20/23 1455)  ketorolac (TORADOL) 15 MG/ML injection 15 mg (15 mg Intravenous Given 01/20/23 1455)  ondansetron (ZOFRAN) injection 4 mg (4 mg Intravenous Given 01/20/23 1455)    ED Course/ Medical Decision Making/ A&P                                 Medical Decision Making Amount and/or Complexity of Data Reviewed Labs: ordered. Radiology: ordered. ECG/medicine tests: ordered.  Risk Prescription drug management.   Initial Impression and Ddx 67 year old well-appearing male presenting for headache and dizziness.  Exam was unremarkable.  DDx includes stroke, hypertensive emergency, brain mets, electrolyte derangement, dehydration, arrhythmia. Patient PMH that increases complexity of ED encounter:  stage IV prostate cancer  Interpretation of Diagnostics - I independent reviewed and interpreted the labs as followed: Leukopenia  - I independently visualized the following imaging with scope of interpretation limited to determining acute life threatening conditions related to emergency care: CT head, Brain MRI, which revealed no acute findings  -I personally reviewed and interpreted EKG which revealed normal sinus rhythm.  Patient Reassessment and Ultimate Disposition/Management On reassessment, patient stated that he "felt much better" stating that headache had gone away and dizziness was significantly improved.  Ambulated patient down the hall and he did well and said he did not feel dizzy.  Suspect his vertiginous symptoms are likely peripheral.  Overall he looks well and nontoxic.  Sent meclizine to his pharmacy advised him to follow-up with his PCP for his symptoms today along with his ongoing stress and anxiety.  Discussed return precautions.  Vitals stable.  Discharged in good condition.  Patient management required discussion with the following services or consulting groups:  None  Complexity of Problems Addressed Acute complicated illness or  Injury  Additional Data Reviewed and Analyzed Further history obtained from: Past medical history and medications listed in the EMR and Prior ED visit notes  Patient Encounter Risk Assessment Prescriptions         Final Clinical Impression(s) / ED Diagnoses Final diagnoses:  Nonintractable headache, unspecified chronicity pattern, unspecified headache type  Dizziness    Rx / DC Orders ED Discharge Orders     None         Gareth Eagle, PA-C 01/20/23 1950    Glendora Score, MD 01/24/23 1918

## 2023-01-20 NOTE — Discharge Instructions (Signed)
Evaluation for your headache and dizziness was overall reassuring.  Recommend you do follow-up with your PCP for your symptoms today along with your issues with ongoing stress and anxiety.  If you have worsening headache, worsening dizziness, facial numbness, numbness or weakness in your extremities, abnormal gait or any other concerning symptom please return emergency department for further evaluation.

## 2023-01-30 ENCOUNTER — Other Ambulatory Visit (HOSPITAL_COMMUNITY): Payer: Self-pay

## 2023-02-03 ENCOUNTER — Telehealth: Payer: Self-pay

## 2023-02-03 NOTE — Telephone Encounter (Signed)
Patient called reporting numbness, tingling, and burning in his thighs. Patient questions if this is related to zytiga. Patient states that they have had neuropathy related pain that feels similarly to what he is currently experiencing. He tells me that gabapentin worked well for him then. Recommended that patient call his PCP who has previously managed neuropathy to discuss potentially restarting gabapentin.

## 2023-02-06 ENCOUNTER — Other Ambulatory Visit: Payer: Self-pay

## 2023-02-06 ENCOUNTER — Encounter: Payer: Self-pay | Admitting: Hematology

## 2023-02-06 NOTE — Progress Notes (Signed)
Specialty Pharmacy Refill Coordination Note  Brendan Anderson is a 67 y.o. male contacted today regarding refills of specialty medication(s) Abiraterone Acetate   Patient requested Delivery   Delivery date: 02/13/23   Verified address: [address] [place] [address]   Medication will be filled on 02/10/23.   Discount cash pricing $140.

## 2023-02-10 ENCOUNTER — Encounter: Payer: Self-pay | Admitting: Hematology

## 2023-02-10 ENCOUNTER — Other Ambulatory Visit: Payer: Self-pay

## 2023-02-14 NOTE — Progress Notes (Signed)
Brendan Anderson [address] [place] [address]    Clinic Day:  02/16/2023  Referring physician: Kathlee Nations, MD  Patient Care Team: Kathlee Nations, MD as PCP - General (Internal Medicine) Felicita Gage, RN as Registered Nurse (Medical Oncology) Doreatha Massed, MD as Medical Oncologist (Medical Oncology) Therese Sarah, RN as Oncology Nurse Navigator (Medical Oncology)   ASSESSMENT & PLAN:   Assessment:   1.  Stage IV (pT3b pN1 M1) prostate cancer: - Prostate biopsy (04/24/2019): Prostatic adenocarcinoma, Gleason's 4+5=9, done in [place] by Dr. Ian Bushman.  PSA was 6.3. - Radical prostatectomy and lymph node resection (06/27/2019) - Pathology: Prostatic adenocarcinoma, Gleason 5+4=9 (grade group 5).  Extraprostatic extension is present at the right posterior mid, bladder neck and bilateral posterior base, bilateral seminal vesicles invasion present (pT3b).  LVI present.  Margins negative.  Metastatic adenocarcinoma in 1/4 lymph nodes in the right pelvis.  0/5 lymph nodes in the left pelvis.  PT3BPN1. - Postsurgery PSA of 0.2.  10/29/2019 PSA 0.38. - XRT to prostate fossa, pelvic lymph nodes from 12/02/2019 - 01/23/2020 with short-term ADT - PSA initially became undetectable, but quickly began increasing and was noted to have biochemical recurrence in November 2022, PSA 0.46.  PSA increased to 2.04 in February 2023. - PSMA PET scan (07/05/2021): 2 very small presumed foci of nodal tissue in the left middle mediastinum along the descending thoracic aorta concerning for nodal prostate cancer metastasis.  No evidence of local recurrence in the prostatectomy bed or nodal metastasis in the pelvis.  No evidence of visceral or skeletal metastasis. - Germline mutation testing ([facility]) negative on 12/11/2019 - SBRT to the PET positive intrathoracic lymph nodes, 50 Gray in 5 fractions from 08/16/2021 through 08/27/2021. - PSA: 2.26 (10/22/2021), 2.04 (06/08/2021), 0.46 (02/09/2021), 0.17  (11/05/2020), <0.015 (04/22/2020), 0.38 (10/29/2019), 0.20 (09/19/2019). - Total testosterone 427 (11/04/2020) - PSMA PET scan (10/16/2022): New small focus of activity within the central and 1 vertebral body and within the left transverse process of the T11 vertebral body.  No evidence of cancer in the prostate bed or adenopathy. - Status post XRT 50 Gray in 5 fractions completed on 11/22/2022 - PSA increased to 12.8 on 12/15/2022.  He is a candidate for triplet therapy based on Gleason 9 (definition of "high risk" disease as used in LATITUDE trial).  However because of oligometastatic disease, we decided on ADT and abiraterone. - ADT with abiraterone started on 12/15/2022   2.  Social/family history: - He is married and lives with his wife.  He worked with heating and air conditioning.  He had very little exposure to asbestos.  No other chemical exposure. - He quit smoking in 1985. - Mother had breast cancer in her early 35s and died with acute leukemia few months after receiving chemotherapy for breast cancer.  Plan:  1.  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 - He is tolerating Zytiga and prednisone very well.  He has already has no hot flashes and some fatigue since the start of ADT. - He reported upper thighs felt like sunburn mostly at nighttime and improves with activity during daytime.  No rash reported.  He also has sacral pain/discomfort and occasionally requires Advil. - I have reviewed recent PET scan which did not show any metastatic disease in the pelvis.  Most likely the effect of previous radiation therapy. - Reviewed labs from today: Normal LFTs.  Potassium was normal.  CBC shows white  count 3.6 and ANC of 2.1.  Hemoglobin and platelets are normal.  PSA from today is pending. - He had recent visit to the ER with headache.  MRI of the brain on 01/20/2023 did not show any acute intracranial process. - Continue Zytiga 1000 mg daily and prednisone 5 mg  daily.  RTC 8 weeks for follow-up with repeat PSA and other labs.   2.  Osteoporosis: - Continue Prolia injections every 6 months with Dr. Orvan Falconer in [place]. - Continue calcium and vitamin D supplements.  No orders of the defined types were placed in this encounter.     [name],acting as a Neurosurgeon for Doreatha Massed, MD.,have documented all relevant documentation on the behalf of Doreatha Massed, MD,as directed by  Doreatha Massed, MD while in the presence of Doreatha Massed, MD.  I, Doreatha Massed MD, have reviewed the above documentation for accuracy and completeness, and I agree with the above.     Doreatha Massed, MD   12/5/20242:06 PM  CHIEF COMPLAINT:   Diagnosis: Prostate cancer metastatic to intrapelvic lymph node    Cancer Staging  No matching staging information was found for the patient.    Prior Therapy:  Radical prostatectomy and BPL ND on 06/27/2019  XRT to prostate fossa, pelvic lymph nodes from 12/02/2019 - 01/23/2020 with short-term ADT  SBRT to the mediastinal lymph nodes, 50 Gray in 5 fractions from 08/16/2021 through 08/27/2021  Current Therapy: ADT and abiraterone/prednisone   HISTORY OF PRESENT ILLNESS:   Oncology History  Prostate cancer metastatic to intrapelvic lymph node (HCC)  08/07/2018 Initial Diagnosis   Prostate cancer metastatic to intrapelvic lymph node (HCC)   12/19/2019 Genetic Testing   Negative genetic testing on the multicancer panel.  The Multi-Gene Panel offered by [facility] includes sequencing and/or deletion duplication testing of the following 85 genes: AIP, ALK, APC, ATM, AXIN2,BAP1,  BARD1, BLM, BMPR1A, BRCA1, BRCA2, BRIP1, CASR, CDC73, CDH1, CDK4, CDKN1B, CDKN1C, CDKN2A (p14ARF), CDKN2A (p16INK4a), CEBPA, CHEK2, CTNNA1, DICER1, DIS3L2, EGFR (c.2369C>T, p.Thr790Met variant only), EPCAM (Deletion/duplication testing only), FH, FLCN, GATA2, GPC3, GREM1 (Promoter region deletion/duplication testing  only), HOXB13 (c.251G>A, p.Gly84Glu), HRAS, KIT, MAX, MEN1, MET, MITF (c.952G>A, p.Glu318Lys variant only), MLH1, MSH2, MSH3, MSH6, MUTYH, NBN, NF1, NF2, NTHL1, PALB2, PDGFRA, PHOX2B, PMS2, POLD1, POLE, POT1, PRKAR1A, PTCH1, PTEN, RAD50, RAD51C, RAD51D, RB1, RECQL4, RET, RNF43, RUNX1, SDHAF2, SDHA (sequence changes only), SDHB, SDHC, SDHD, SMAD4, SMARCA4, SMARCB1, SMARCE1, STK11, SUFU, TERC, TERT, TMEM127, TP53, TSC1, TSC2, VHL, WRN and WT1.  The report date is December 19, 2019.      INTERVAL HISTORY:   Elster is a 67 y.o. male presenting to clinic today for follow up of Castration sensitive metastatic prostate cancer. He was last seen by me on 01/05/23.  Since his last visit, He presented to the ED on 01/20/23 for nonintractable headache. Imaging saw no acute findings and he was discharged on meclizine.   Today, he states that he is doing well overall. His appetite level is at 100%. His energy level is at 0%. He is accompanied by his wife.   After receiving his last Firmagon injection, he notes a soreness similar to a sunburn. He reports a burning sensation similar to a sun burn in the bilateral legs that radiates to the groin area with an onset of 3 weeks. He denies any erythema or pain to the areas. Symptoms have improved since onset.   He notes pain in the sacral area, worsened in the morning and when standing up from a  seated position. He occasionally takes OTC Advil and topical Voltaren.   He is tolerating Zytiga and prednisone well. He reports increased fatigue and occasional hot flashes from treatment. He denies any nausea or vomiting.   PAST MEDICAL HISTORY:   Past Medical History: Past Medical History:  Diagnosis Date   Anxiety    Arthritis    hip knees wrist   Depression    Family history of breast cancer    Family history of prostate cancer    GERD (gastroesophageal reflux disease)    Osteoporosis    Prostate cancer [facility])    Skin cancer     Surgical History: Past  Surgical History:  Procedure Laterality Date   BICEPS TENDON REPAIR Left 07/2016   EYE SURGERY Bilateral 2005   LYMPHADENECTOMY Bilateral 06/27/2019   Procedure: LYMPHADENECTOMY, PELVIC;  Surgeon: Heloise Purpura, MD;  Location: [facility];  Service: Urology;  Laterality: Bilateral;   MOLE REMOVAL  2008   pre CA from face   NASAL SINUS SURGERY  2010   ROBOT ASSISTED LAPAROSCOPIC RADICAL PROSTATECTOMY N/A 06/27/2019   Procedure: XI ROBOTIC ASSISTED LAPAROSCOPIC RADICAL PROSTATECTOMY LEVEL 2;  Surgeon: Heloise Purpura, MD;  Location: [facility];  Service: Urology;  Laterality: N/A;   SHOULDER ARTHROSCOPY WITH ROTATOR CUFF REPAIR Left 11/2016    Social History: Social History   Socioeconomic History   Marital status: Married    Spouse name: Kendal Hymen   Number of children: 2   Years of education: Not on file   Highest education level: Not on file  Occupational History   Occupation: retired    Comment: Garment/textile technologist  Tobacco Use   Smoking status: Former    Current packs/day: 0.00    Average packs/day: 0.3 packs/day for 10.0 years (2.5 ttl pk-yrs)    Types: Cigars, Cigarettes    Start date: 06/18/1973    Quit date: 06/19/1983    Years since quitting: 39.6   Smokeless tobacco: Never  Vaping Use   Vaping status: Never Used  Substance and Sexual Activity   Alcohol use: Yes    Comment: rare   Drug use: Never   Sexual activity: Not Currently  Other Topics Concern   Not on file  Social History Narrative   Not on file   Social Determinants of Health   Financial Resource Strain: Not on file  Food Insecurity: No Food Insecurity (11/03/2022)   Hunger Vital Sign    Worried About Running Out of Food in the Last Year: Never true    Ran Out of Food in the Last Year: Never true  Transportation Needs: No Transportation Needs (11/03/2022)   PRAPARE - Administrator, Civil Service (Medical): No    Lack of Transportation (Non-Medical): No  Physical Activity: Not on file   Stress: Not on file  Social Connections: Not on file  Intimate Partner Violence: Not At Risk (11/03/2022)   Humiliation, Afraid, Rape, and Kick questionnaire    Fear of Current or Ex-Partner: No    Emotionally Abused: No    Physically Abused: No    Sexually Abused: No    Family History: Family History  Problem Relation Age of Onset   Leukemia Mother    Breast cancer Mother 58   Cancer Father        NOS   Breast cancer Paternal Aunt        dx in her 67s   Brain cancer Paternal Uncle        d. 28s  Heart attack Maternal Grandmother    Stroke Maternal Grandfather    Asthma Paternal Grandmother    Prostate cancer Paternal Grandfather    Cancer Nephew 10       brother's grandson - NOS   Colon cancer Neg Hx    Pancreatic cancer Neg Hx     Current Medications:  Current Outpatient Medications:    abiraterone acetate (ZYTIGA) 250 MG tablet, Take 4 tablets (1,000 mg total) by mouth daily. Take on an empty stomach 1 hour before or 2 hours after a meal, Disp: 120 tablet, Rfl: 3   Al Hyd-Mg Tr-Alg Ac-Sod Bicarb (GAVISCON-2 PO), Take 2 tablets by mouth at bedtime., Disp: , Rfl:    ascorbic acid (VITAMIN C) 250 MG tablet, Take 500 mg by mouth daily at 12 noon., Disp: , Rfl:    Calcium Carbonate Antacid 400 MG CHEW, Chew 800 mg by mouth 2 (two) times daily., Disp: , Rfl:    celecoxib (CELEBREX) 200 MG capsule, Take 200 mg by mouth daily., Disp: , Rfl:    cetirizine (ZYRTEC) 10 MG tablet, Take 10 mg by mouth daily as needed for allergies., Disp: , Rfl:    denosumab (PROLIA) 60 MG/ML SOSY injection, Inject 60 mg into the skin every 6 (six) months., Disp: , Rfl:    esomeprazole (NEXIUM) 40 MG capsule, Take 40 mg by mouth daily., Disp: , Rfl:    meclizine (ANTIVERT) 12.5 MG tablet, Take 1 tablet (12.5 mg total) by mouth 3 (three) times daily as needed for dizziness., Disp: 15 tablet, Rfl: 0   Multiple Vitamin (MULTIVITAMIN) tablet, Take 1 tablet by mouth daily., Disp: , Rfl:    Multiple  Vitamins-Minerals (MULTIVITAMIN ADULT, MINERALS,) TABS, Take 1 tablet by mouth daily., Disp: , Rfl:    pantoprazole (PROTONIX) 40 MG tablet, Take 40 mg by mouth daily., Disp: , Rfl:    predniSONE (DELTASONE) 5 MG tablet, Take 1 tablet (5 mg total) by mouth daily with breakfast., Disp: 30 tablet, Rfl: 6   sildenafil (VIAGRA) 100 MG tablet, Take 25 mg by mouth daily as needed for erectile dysfunction., Disp: , Rfl:    tizanidine (ZANAFLEX) 2 MG capsule, Take 2 mg by mouth daily as needed for muscle spasms., Disp: , Rfl:    traZODone (DESYREL) 100 MG tablet, Take 100 mg by mouth at bedtime., Disp: , Rfl:    Vitamin D, Ergocalciferol, (DRISDOL) 1.25 MG (50000 UNIT) CAPS capsule, Take 50,000 Units by mouth once a week. Wednesday, Disp: , Rfl:    Allergies: Allergies  Allergen Reactions   Codeine Other (See Comments)    "MAKES HIM HYPER"    REVIEW OF SYSTEMS:   Review of Systems  Constitutional:  Positive for fatigue. Negative for chills and fever.  HENT:   Negative for lump/mass, mouth sores, nosebleeds, sore throat and trouble swallowing.   Eyes:  Negative for eye problems.  Respiratory:  Negative for cough and shortness of breath.   Cardiovascular:  Negative for chest pain, leg swelling and palpitations.  Gastrointestinal:  Negative for abdominal pain, constipation, diarrhea, nausea and vomiting.  Endocrine: Positive for hot flashes.  Genitourinary:  Negative for bladder incontinence, difficulty urinating, dysuria, frequency, hematuria and nocturia.   Musculoskeletal:  Positive for back pain. Negative for arthralgias, flank pain, myalgias and neck pain.       +groin and bilateral leg pain, 5/10 severity  Skin:  Negative for itching and rash.  Neurological:  Positive for dizziness and headaches. Negative for numbness.  Hematological:  Does  not bruise/bleed easily.  Psychiatric/Behavioral:  Positive for depression. Negative for sleep disturbance and suicidal ideas. The patient is not  nervous/anxious.   All other systems reviewed and are negative.    VITALS:   Blood pressure 125/79, pulse 83, temperature (!) 97.1 F (36.2 C), resp. rate 18, weight 186 lb 3.2 oz (84.5 kg), SpO2 98%.  Wt Readings from Last 3 Encounters:  02/16/23 186 lb 3.2 oz (84.5 kg)  01/20/23 180 lb (81.6 kg)  01/05/23 187 lb 12.8 oz (85.2 kg)    Body mass index is 28.31 kg/m.  Performance status (ECOG): 1 - Symptomatic but completely ambulatory  PHYSICAL EXAM:   Physical Exam Vitals and nursing note reviewed. Exam conducted with a chaperone present.  Constitutional:      Appearance: Normal appearance.  Cardiovascular:     Rate and Rhythm: Normal rate and regular rhythm.     Pulses: Normal pulses.     Heart sounds: Normal heart sounds.  Pulmonary:     Effort: Pulmonary effort is normal.     Breath sounds: Normal breath sounds.  Abdominal:     Palpations: Abdomen is soft. There is no hepatomegaly, splenomegaly or mass.     Tenderness: There is no abdominal tenderness.  Musculoskeletal:     Right lower leg: No edema.     Left lower leg: No edema.  Lymphadenopathy:     Cervical: No cervical adenopathy.     Right cervical: No superficial, deep or posterior cervical adenopathy.    Left cervical: No superficial, deep or posterior cervical adenopathy.     Upper Body:     Right upper body: No supraclavicular or axillary adenopathy.     Left upper body: No supraclavicular or axillary adenopathy.  Neurological:     General: No focal deficit present.     Mental Status: He is alert and oriented to person, place, and time.  Psychiatric:        Mood and Affect: Mood normal.        Behavior: Behavior normal.     LABS:      Latest Ref Rng & Units 02/16/2023   10:44 AM 01/20/2023   12:08 PM 01/05/2023   10:44 AM  CBC  WBC 4.0 - 10.5 K/uL 3.6  3.3  3.2   Hemoglobin 13.0 - 17.0 g/dL 62.1  30.8  65.7   Hematocrit 39.0 - 52.0 % 35.3  37.6  37.8   Platelets 150 - 400 K/uL 155  144  147        Latest Ref Rng & Units 02/16/2023   10:44 AM 01/20/2023   12:08 PM 01/05/2023   10:44 AM  CMP  Glucose 70 - 99 mg/dL 846  962  952   BUN 8 - 23 mg/dL 17  16  24    Creatinine 0.61 - 1.24 mg/dL 8.41  3.24  4.01   Sodium 135 - 145 mmol/L 140  138  138   Potassium 3.5 - 5.1 mmol/L 4.0  4.1  4.0   Chloride 98 - 111 mmol/L 105  108  103   CO2 22 - 32 mmol/L 27  23  26    Calcium 8.9 - 10.3 mg/dL 8.8  8.4  8.8   Total Protein 6.5 - 8.1 g/dL 6.3  6.4  6.8   Total Bilirubin <1.2 mg/dL 0.5  0.6  0.6   Alkaline Phos 38 - 126 U/L 39  45  52   AST 15 - 41 U/L 17  23  17   ALT 0 - 44 U/L 16  23  18       No results found for: "CEA1", "CEA" / No results found for: "CEA1", "CEA" No results found for: "PSA1" No results found for: "CZY606" No results found for: "CAN125"  No results found for: "TOTALPROTELP", "ALBUMINELP", "A1GS", "A2GS", "BETS", "BETA2SER", "GAMS", "MSPIKE", "SPEI" No results found for: "TIBC", "FERRITIN", "IRONPCTSAT" No results found for: "LDH"   STUDIES:   MR BRAIN WO CONTRAST  Result Date: 01/20/2023 CLINICAL DATA:  Sudden onset headache and transient alteration awareness, unsteadiness on his feet, persistent headache EXAM: MRI HEAD WITHOUT CONTRAST TECHNIQUE: Multiplanar, multiecho pulse sequences of the brain and surrounding structures were obtained without intravenous contrast. COMPARISON:  No prior MRI available, correlation is made with 01/20/2023 CT head FINDINGS: Brain: No restricted diffusion to suggest acute or subacute infarct. No acute hemorrhage, mass, mass effect, or midline shift. No hydrocephalus or extra-axial collection. Pituitary and craniocervical junction within normal limits. No hemosiderin deposition to suggest remote hemorrhage. 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 Scattered and confluent T2 hyperintense signal in the periventricular white matter, likely the sequela of mild-to-moderate chronic small vessel ischemic disease. Vascular:  Normal arterial flow voids. Skull and upper cervical spine: Normal marrow signal. Sinuses/Orbits: Mucosal thickening in inferior right frontal sinus. Small mucous retention cyst in the right nasal cavity. No acute finding in the orbits. Status post bilateral lens replacements. Other: The mastoid air cells are well aerated. IMPRESSION: No acute intracranial process. No evidence of acute or subacute infarct. Electronically Signed   By: Wiliam Ke M.D.   On: 01/20/2023 19:02   CT Head Wo Contrast  Result Date: 01/20/2023 CLINICAL DATA:  Headache, increasing frequency or severity EXAM: CT HEAD WITHOUT CONTRAST TECHNIQUE: Contiguous axial images were obtained from the base of the skull through the vertex without intravenous contrast. RADIATION DOSE REDUCTION: This exam was performed according to the departmental dose-optimization program which includes automated exposure control, adjustment of the mA and/or kV according to patient size and/or use of iterative reconstruction technique. COMPARISON:  None Available. FINDINGS: Brain: No evidence of acute infarction, hemorrhage, hydrocephalus, extra-axial collection or mass lesion/mass effect. Vascular: No hyperdense vessel or unexpected calcification. Skull: Normal. Negative for fracture or focal lesion. Sinuses/Orbits: No middle ear or mastoid effusion. Paranasal sinuses are notable for postsurgical changes from prior FESS. Bilateral lens replacement. Orbits are otherwise unremarkable. Other: None. IMPRESSION: No CT etiology for headaches identified Electronically Signed   By: Lorenza Cambridge M.D.   On: 01/20/2023 15:08

## 2023-02-16 ENCOUNTER — Inpatient Hospital Stay: Payer: Medicare Other | Admitting: Hematology

## 2023-02-16 ENCOUNTER — Inpatient Hospital Stay: Payer: Medicare Other | Attending: Hematology | Admitting: Hematology

## 2023-02-16 VITALS — BP 125/79 | HR 83 | Temp 97.1°F | Resp 18 | Wt 186.2 lb

## 2023-02-16 DIAGNOSIS — Z87891 Personal history of nicotine dependence: Secondary | ICD-10-CM | POA: Diagnosis not present

## 2023-02-16 DIAGNOSIS — C775 Secondary and unspecified malignant neoplasm of intrapelvic lymph nodes: Secondary | ICD-10-CM | POA: Insufficient documentation

## 2023-02-16 DIAGNOSIS — Z923 Personal history of irradiation: Secondary | ICD-10-CM | POA: Diagnosis not present

## 2023-02-16 DIAGNOSIS — Z9079 Acquired absence of other genital organ(s): Secondary | ICD-10-CM | POA: Diagnosis not present

## 2023-02-16 DIAGNOSIS — C7951 Secondary malignant neoplasm of bone: Secondary | ICD-10-CM | POA: Diagnosis not present

## 2023-02-16 DIAGNOSIS — M810 Age-related osteoporosis without current pathological fracture: Secondary | ICD-10-CM | POA: Diagnosis not present

## 2023-02-16 DIAGNOSIS — C61 Malignant neoplasm of prostate: Secondary | ICD-10-CM | POA: Diagnosis present

## 2023-02-16 DIAGNOSIS — Z7952 Long term (current) use of systemic steroids: Secondary | ICD-10-CM | POA: Diagnosis not present

## 2023-02-16 LAB — COMPREHENSIVE METABOLIC PANEL
ALT: 16 U/L (ref 0–44)
AST: 17 U/L (ref 15–41)
Albumin: 3.7 g/dL (ref 3.5–5.0)
Alkaline Phosphatase: 39 U/L (ref 38–126)
Anion gap: 8 (ref 5–15)
BUN: 17 mg/dL (ref 8–23)
CO2: 27 mmol/L (ref 22–32)
Calcium: 8.8 mg/dL — ABNORMAL LOW (ref 8.9–10.3)
Chloride: 105 mmol/L (ref 98–111)
Creatinine, Ser: 1.1 mg/dL (ref 0.61–1.24)
GFR, Estimated: >60 mL/min (ref >60)
Glucose, Bld: 103 mg/dL — ABNORMAL HIGH (ref 70–99)
Potassium: 4 mmol/L (ref 3.5–5.1)
Sodium: 140 mmol/L (ref 135–145)
Total Bilirubin: 0.5 mg/dL (ref <1.2)
Total Protein: 6.3 g/dL — ABNORMAL LOW (ref 6.5–8.1)

## 2023-02-16 LAB — CBC WITH DIFFERENTIAL/PLATELET
Abs Immature Granulocytes: 0.01 10*3/uL (ref 0.00–0.07)
Basophils Absolute: 0 10*3/uL (ref 0.0–0.1)
Basophils Relative: 1 %
Eosinophils Absolute: 0.5 10*3/uL (ref 0.0–0.5)
Eosinophils Relative: 14 %
HCT: 35.3 % — ABNORMAL LOW (ref 39.0–52.0)
Hemoglobin: 12.2 g/dL — ABNORMAL LOW (ref 13.0–17.0)
Immature Granulocytes: 0 %
Lymphocytes Relative: 16 %
Lymphs Abs: 0.6 10*3/uL — ABNORMAL LOW (ref 0.7–4.0)
MCH: 32.5 pg (ref 26.0–34.0)
MCHC: 34.6 g/dL (ref 30.0–36.0)
MCV: 94.1 fL (ref 80.0–100.0)
Monocytes Absolute: 0.4 10*3/uL (ref 0.1–1.0)
Monocytes Relative: 10 %
Neutro Abs: 2.1 10*3/uL (ref 1.7–7.7)
Neutrophils Relative %: 59 %
Platelets: 155 10*3/uL (ref 150–400)
RBC: 3.75 MIL/uL — ABNORMAL LOW (ref 4.22–5.81)
RDW: 13.1 % (ref 11.5–15.5)
WBC: 3.6 10*3/uL — ABNORMAL LOW (ref 4.0–10.5)
nRBC: 0 % (ref 0.0–0.2)

## 2023-02-16 LAB — MAGNESIUM: Magnesium: 2.3 mg/dL (ref 1.7–2.4)

## 2023-02-16 LAB — PSA: Prostatic Specific Antigen: 0.65 ng/mL (ref 0.00–4.00)

## 2023-02-16 NOTE — Patient Instructions (Addendum)
[facility] at [facility] Discharge Instructions   You were seen and examined today by Dr. Ellin Saba.  He reviewed the results of your lab work which were normal/stable. The results of your PSA are pending.   Continue taking Zytiga as prescribed.   We will see you back in 2 months. We will repeat lab work at this visit.  Return as scheduled.    Thank you for choosing [facility] at [facility] to provide your oncology and hematology care.  To afford each patient quality time with our provider, please arrive at least 15 minutes before your scheduled appointment time.   If you have a lab appointment with the Cancer Center please come in thru the Main Entrance and check in at the main information desk.  You need to re-schedule your appointment should you arrive 10 or more minutes late.  We strive to give you quality time with our providers, and arriving late affects you and other patients whose appointments are after yours.  Also, if you no show three or more times for appointments you may be dismissed from the clinic at the providers discretion.     Again, thank you for choosing [facility].  Our hope is that these requests will decrease the amount of time that you wait before being seen by our physicians.       _____________________________________________________________  Should you have questions after your visit to [facility], please contact our office at [phone] and follow the prompts.  Our office hours are 8:00 a.m. and 4:30 p.m. Monday - Friday.  Please note that voicemails left after 4:00 p.m. may not be returned until the following business day.  We are closed weekends and major holidays.  You do have access to a nurse 24-7, just call the main number to the clinic [phone] and do not press any options, hold on the line and a nurse will answer the phone.    For prescription refill requests, have your  pharmacy contact our office and allow 72 hours.    Due to Covid, you will need to wear a mask upon entering the hospital. If you do not have a mask, a mask will be given to you at the Main Entrance upon arrival. For doctor visits, patients may have 1 support person age 64 or older with them. For treatment visits, patients can not have anyone with them due to social distancing guidelines and our immunocompromised population.

## 2023-02-16 NOTE — Progress Notes (Signed)
Patient is taking Zytiga as prescribed.  He has not missed any doses and reports no side effects at this time.   

## 2023-02-20 NOTE — Progress Notes (Signed)
Patient made aware of result

## 2023-02-21 ENCOUNTER — Encounter: Payer: Self-pay | Admitting: Hematology

## 2023-03-07 ENCOUNTER — Other Ambulatory Visit (HOSPITAL_COMMUNITY): Payer: Self-pay | Admitting: Pharmacy Technician

## 2023-03-07 ENCOUNTER — Encounter: Payer: Self-pay | Admitting: Hematology

## 2023-03-07 ENCOUNTER — Other Ambulatory Visit (HOSPITAL_COMMUNITY): Payer: Self-pay

## 2023-03-07 NOTE — Progress Notes (Signed)
Specialty Pharmacy Refill Coordination Note  Brendan Anderson is a 67 y.o. male contacted today regarding refills of specialty medication(s) Abiraterone Acetate [name]   Patient requested Delivery   Delivery date: 03/14/23   Verified address: [address] [place]   Medication will be filled on 03/13/23.

## 2023-03-13 ENCOUNTER — Encounter: Payer: Self-pay | Admitting: Hematology

## 2023-03-13 ENCOUNTER — Telehealth: Payer: Self-pay

## 2023-03-13 ENCOUNTER — Other Ambulatory Visit (HOSPITAL_COMMUNITY): Payer: Self-pay

## 2023-03-13 ENCOUNTER — Other Ambulatory Visit: Payer: Self-pay

## 2023-03-13 NOTE — Telephone Encounter (Signed)
Oral Oncology Patient Advocate Encounter  Was successful in securing patient a $8,000.00 grant from [facility] to provide copayment coverage for Abiraterone.  This will keep the out of pocket expense at $0.     [employer] ID: [id number]   The billing information is as follows and has been shared with [facility].    RxBin: [id number] PCN: [id number] Member ID: [id number] Group ID: [id number] Dates of Eligibility: 02/04/23 through 02/03/24  Fund:  Prostate Cancer - Medicare Access   Ardeen Fillers, CPhT Oncology Pharmacy Patient Advocate  [facility]  [phone] (phone) [phone] (fax) 03/13/2023 2:17 PM

## 2023-03-13 NOTE — Telephone Encounter (Signed)
Called and spoke to patient regarding grant approval. Patient knows co-pay moving forward will be $0.00. Patient also knows to call me at [phone] with any questions or concerns regarding receiving medication or if there is any unexpected change in co-pay.    Ardeen Fillers, CPhT Oncology Pharmacy Patient Advocate  [facility]  [phone] (phone) [phone] (fax) 03/13/2023 2:31 PM

## 2023-04-03 ENCOUNTER — Other Ambulatory Visit: Payer: Self-pay | Admitting: Hematology

## 2023-04-03 ENCOUNTER — Other Ambulatory Visit: Payer: Self-pay

## 2023-04-03 ENCOUNTER — Other Ambulatory Visit (HOSPITAL_COMMUNITY): Payer: Self-pay

## 2023-04-03 DIAGNOSIS — C61 Malignant neoplasm of prostate: Secondary | ICD-10-CM

## 2023-04-03 MED ORDER — ABIRATERONE ACETATE 250 MG PO TABS
1000.0000 mg | ORAL_TABLET | Freq: Every day | ORAL | 3 refills | Status: DC
  Filled 2023-04-03: qty 120, 30d supply, fill #0
  Filled 2023-05-02: qty 120, 30d supply, fill #1
  Filled 2023-06-05: qty 120, 30d supply, fill #2
  Filled 2023-07-03: qty 120, 30d supply, fill #3

## 2023-04-03 NOTE — Progress Notes (Signed)
Specialty Pharmacy Ongoing Clinical Assessment Note  Brendan Anderson is a 68 y.o. male who is being followed by the specialty pharmacy service for RxSp Oncology   Patient's specialty medication(s) reviewed today: Abiraterone Acetate (ZYTIGA)   Missed doses in the last 4 weeks: 0   Patient/Caregiver did not have any additional questions or concerns.   Therapeutic benefit summary: Patient is achieving benefit   Adverse events/side effects summary: Experienced adverse events/side effects (Headache for last 7-8 days which is sometimes relieved by Tylenol. Per Alyson patient may wait until next appointment or call nurse/go to ER if severity increases sooner or other symptoms arise.)   Patient's therapy is appropriate to: Continue    Goals Addressed             This Visit's Progress    Slow Disease Progression       Patient is on track. Patient will maintain adherence. PSA 02/16/23 significantly decreased to 0.65.           Follow up:  3 months  Otto Herb Specialty Pharmacist

## 2023-04-03 NOTE — Progress Notes (Signed)
Patient inquired about grant payment for previous AR charges of $140 for November and December. Insurance/grant billed for December charge and refunded AR acct. Patient aware of this and that I will have to check further if we can do the same for November and call him back.

## 2023-04-03 NOTE — Progress Notes (Signed)
Adjusted November claim as well so patient AR balance now $0. Patient aware.

## 2023-04-03 NOTE — Progress Notes (Signed)
Specialty Pharmacy Refill Coordination Note  Brendan Anderson is a 68 y.o. male contacted today regarding refills of specialty medication(s) Abiraterone Acetate [name]   Patient requested Delivery   Delivery date: 04/11/23   Verified address: [address] [place]   Medication will be filled on 04/10/23.

## 2023-04-04 ENCOUNTER — Other Ambulatory Visit (HOSPITAL_COMMUNITY): Payer: Self-pay

## 2023-04-10 ENCOUNTER — Other Ambulatory Visit: Payer: Self-pay

## 2023-04-24 ENCOUNTER — Other Ambulatory Visit: Payer: Self-pay

## 2023-04-24 DIAGNOSIS — C775 Secondary and unspecified malignant neoplasm of intrapelvic lymph nodes: Secondary | ICD-10-CM

## 2023-04-24 DIAGNOSIS — C61 Malignant neoplasm of prostate: Secondary | ICD-10-CM

## 2023-04-24 NOTE — Progress Notes (Signed)
[facility] [address] [place] [address]    Clinic Day:  04/25/2023  Referring physician: Kathlee Nations, MD  Patient Care Team: Brendan Nations, MD as PCP - General (Internal Medicine) Brendan Gage, RN as Registered Nurse (Medical Oncology) Brendan Massed, MD as Medical Oncologist (Medical Oncology) Brendan Sarah, RN as Oncology Nurse Navigator (Medical Oncology)   ASSESSMENT & PLAN:   Assessment:   1.  Stage IV (pT3b pN1 M1) prostate cancer: - Prostate biopsy (04/24/2019): Prostatic adenocarcinoma, Gleason's 4+5=9, done in [place] by Dr. Ian Anderson.  PSA was 6.3. - Radical prostatectomy and lymph node resection (06/27/2019) - Pathology: Prostatic adenocarcinoma, Gleason 5+4=9 (grade group 5).  Extraprostatic extension is present at the right posterior mid, bladder neck and bilateral posterior base, bilateral seminal vesicles invasion present (pT3b).  LVI present.  Margins negative.  Metastatic adenocarcinoma in 1/4 lymph nodes in the right pelvis.  0/5 lymph nodes in the left pelvis.  PT3BPN1. - Postsurgery PSA of 0.2.  10/29/2019 PSA 0.38. - XRT to prostate fossa, pelvic lymph nodes from 12/02/2019 - 01/23/2020 with short-term ADT - PSA initially became undetectable, but quickly began increasing and was noted to have biochemical recurrence in November 2022, PSA 0.46.  PSA increased to 2.04 in February 2023. - PSMA PET scan (07/05/2021): 2 very small presumed foci of nodal tissue in the left middle mediastinum along the descending thoracic aorta concerning for nodal prostate cancer metastasis.  No evidence of local recurrence in the prostatectomy bed or nodal metastasis in the pelvis.  No evidence of visceral or skeletal metastasis. - Germline mutation testing ([facility]) negative on 12/11/2019 - SBRT to the PET positive intrathoracic lymph nodes, 50 Gray in 5 fractions from 08/16/2021 through 08/27/2021. - PSA: 2.26 (10/22/2021), 2.04 (06/08/2021), 0.46 (02/09/2021), 0.17  (11/05/2020), <0.015 (04/22/2020), 0.38 (10/29/2019), 0.20 (09/19/2019). - Total testosterone 427 (11/04/2020) - PSMA PET scan (10/16/2022): New small focus of activity within the central and 1 vertebral body and within the left transverse process of the T11 vertebral body.  No evidence of cancer in the prostate bed or adenopathy. - Status post XRT 50 Gray in 5 fractions completed on 11/22/2022 - PSA increased to 12.8 on 12/15/2022.  He is a candidate for triplet therapy based on Gleason 9 (definition of "high risk" disease as used in LATITUDE trial).  However because of oligometastatic disease, we decided on ADT and abiraterone. - ADT with abiraterone started on 12/15/2022   2.  Social/family history: - He is married and lives with his wife.  He worked with heating and air conditioning.  He had very little exposure to asbestos.  No other chemical exposure. - He quit smoking in 1985. - Mother had breast cancer in her early 49s and died with acute leukemia few months after receiving chemotherapy for breast cancer.  Plan:  1.  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  Last Eligard injection 45 mg on 01/10/2023. - Upper thigh paresthesias have improved since last visit.  He reports fatigue which is worse on and off. - I have recommended that he do weightbearing exercises which will help maintain his muscle mass and improve fatigue. - Labs today: Normal LFTs.  Potassium was normal.  CBC grossly normal.  Last PSA 0.65. - Recommend continuing abiraterone 1000 mg daily and prednisone 5 mg daily. - Will see him back in April when he comes back for his next Eligard injection.  Will repeat PSA at  that time.    2.  Osteoporosis: - Continue Prolia injections every 6 months with Dr. Orvan Anderson in [place].  Continue calcium and vitamin D supplements.  Latest calcium is 9.2.  No orders of the defined types were placed in this encounter.     Brendan Anderson,acting  as a Neurosurgeon for Brendan Massed, MD.,have documented all relevant documentation on the behalf of Brendan Massed, MD,as directed by  Brendan Massed, MD while in the presence of Brendan Massed, MD.  I, Brendan Massed MD, have reviewed the above documentation for accuracy and completeness, and I agree with the above.      Brendan Massed, MD   2/11/20253:30 PM  CHIEF COMPLAINT:   Diagnosis: Prostate cancer metastatic to intrapelvic lymph node    Cancer Staging  No matching staging information was found for the patient.    Prior Therapy:  Radical prostatectomy and BPL ND on 06/27/2019  XRT to prostate fossa, pelvic lymph nodes from 12/02/2019 - 01/23/2020 with short-term ADT  SBRT to the mediastinal lymph nodes, 50 Gray in 5 fractions from 08/16/2021 through 08/27/2021  Current Therapy: ADT and abiraterone/prednisone   HISTORY OF PRESENT ILLNESS:   Oncology History  Prostate cancer metastatic to intrapelvic lymph node (HCC)  08/07/2018 Initial Diagnosis   Prostate cancer metastatic to intrapelvic lymph node (HCC)   12/19/2019 Genetic Testing   Negative genetic testing on the multicancer panel.  The Multi-Gene Panel offered by [facility] includes sequencing and/or deletion duplication testing of the following 85 genes: AIP, ALK, APC, ATM, AXIN2,BAP1,  BARD1, BLM, BMPR1A, BRCA1, BRCA2, BRIP1, CASR, CDC73, CDH1, CDK4, CDKN1B, CDKN1C, CDKN2A (p14ARF), CDKN2A (p16INK4a), CEBPA, CHEK2, CTNNA1, DICER1, DIS3L2, EGFR (c.2369C>T, p.Thr790Met variant only), EPCAM (Deletion/duplication testing only), FH, FLCN, GATA2, GPC3, GREM1 (Promoter region deletion/duplication testing only), HOXB13 (c.251G>A, p.Gly84Glu), HRAS, KIT, MAX, MEN1, MET, MITF (c.952G>A, p.Glu318Lys variant only), MLH1, MSH2, MSH3, MSH6, MUTYH, NBN, NF1, NF2, NTHL1, PALB2, PDGFRA, PHOX2B, PMS2, POLD1, POLE, POT1, PRKAR1A, PTCH1, PTEN, RAD50, RAD51C, RAD51D, RB1, RECQL4, RET, RNF43, RUNX1, SDHAF2, SDHA (sequence  changes only), SDHB, SDHC, SDHD, SMAD4, SMARCA4, SMARCB1, SMARCE1, STK11, SUFU, TERC, TERT, TMEM127, TP53, TSC1, TSC2, VHL, WRN and WT1.  The report date is December 19, 2019.      INTERVAL HISTORY:   Brendan Anderson is a 68 y.o. male presenting to clinic today for follow up of Castration sensitive metastatic prostate cancer. He was last seen by me on 02/16/23.  Today, he states that he is doing well overall. His appetite level is at 100%. His energy level is at 40%. He is accompanied by his wife.   He reports tiredness and fatigue after activity that has worsened since starting abiraterone. Fatigue frequently impedes day to day activities. Sunburn sensation felt on bilateral upper thighs have resolved. Haris states he recently reinjured his back and was given a Z pack for pain, which has resolved prior to this visit. He denies any ankle swelling or other new onset pains.   PAST MEDICAL HISTORY:   Past Medical History: Past Medical History:  Diagnosis Date   Anxiety    Arthritis    hip knees wrist   Depression    Family history of breast cancer    Family history of prostate cancer    GERD (gastroesophageal reflux disease)    Osteoporosis    Prostate cancer [facility])    Skin cancer     Surgical History: Past Surgical History:  Procedure Laterality Date   BICEPS TENDON REPAIR Left 07/2016   EYE SURGERY Bilateral  2005   LYMPHADENECTOMY Bilateral 06/27/2019   Procedure: LYMPHADENECTOMY, PELVIC;  Surgeon: Heloise Purpura, MD;  Location: [facility];  Service: Urology;  Laterality: Bilateral;   MOLE REMOVAL  2008   pre CA from face   NASAL SINUS SURGERY  2010   ROBOT ASSISTED LAPAROSCOPIC RADICAL PROSTATECTOMY N/A 06/27/2019   Procedure: XI ROBOTIC ASSISTED LAPAROSCOPIC RADICAL PROSTATECTOMY LEVEL 2;  Surgeon: Heloise Purpura, MD;  Location: [facility];  Service: Urology;  Laterality: N/A;   SHOULDER ARTHROSCOPY WITH ROTATOR CUFF REPAIR Left 11/2016    Social History: Social History   Socioeconomic  History   Marital status: Married    Spouse name: Kendal Hymen   Number of children: 2   Years of education: Not on file   Highest education level: Not on file  Occupational History   Occupation: retired    Comment: Garment/textile technologist  Tobacco Use   Smoking status: Former    Current packs/day: 0.00    Average packs/day: 0.3 packs/day for 10.0 years (2.5 ttl pk-yrs)    Types: Cigars, Cigarettes    Start date: 06/18/1973    Quit date: 06/19/1983    Years since quitting: 39.8   Smokeless tobacco: Never  Vaping Use   Vaping status: Never Used  Substance and Sexual Activity   Alcohol use: Yes    Comment: rare   Drug use: Never   Sexual activity: Not Currently  Other Topics Concern   Not on file  Social History Narrative   Not on file   Social Drivers of Health   Financial Resource Strain: Not on file  Food Insecurity: No Food Insecurity (11/03/2022)   Hunger Vital Sign    Worried About Running Out of Food in the Last Year: Never true    Ran Out of Food in the Last Year: Never true  Transportation Needs: No Transportation Needs (11/03/2022)   PRAPARE - Administrator, Civil Service (Medical): No    Lack of Transportation (Non-Medical): No  Physical Activity: Not on file  Stress: Not on file  Social Connections: Not on file  Intimate Partner Violence: Not At Risk (11/03/2022)   Humiliation, Afraid, Rape, and Kick questionnaire    Fear of Current or Ex-Partner: No    Emotionally Abused: No    Physically Abused: No    Sexually Abused: No    Family History: Family History  Problem Relation Age of Onset   Leukemia Mother    Breast cancer Mother 25   Cancer Father        NOS   Breast cancer Paternal Aunt        dx in her 17s   Brain cancer Paternal Uncle        d. 45s   Heart attack Maternal Grandmother    Stroke Maternal Grandfather    Asthma Paternal Grandmother    Prostate cancer Paternal Grandfather    Cancer Nephew 10       brother's grandson -  NOS   Colon cancer Neg Hx    Pancreatic cancer Neg Hx     Current Medications:  Current Outpatient Medications:    abiraterone acetate (ZYTIGA) 250 MG tablet, Take 4 tablets (1,000 mg total) by mouth daily. Take on an empty stomach 1 hour before or 2 hours after a meal, Disp: 120 tablet, Rfl: 3   Al Hyd-Mg Tr-Alg Ac-Sod Bicarb (GAVISCON-2 PO), Take 2 tablets by mouth at bedtime., Disp: , Rfl:    ascorbic acid (VITAMIN C) 250 MG tablet,  Take 500 mg by mouth daily at 12 noon., Disp: , Rfl:    Calcium Carbonate Antacid 400 MG CHEW, Chew 800 mg by mouth 2 (two) times daily., Disp: , Rfl:    celecoxib (CELEBREX) 200 MG capsule, Take 200 mg by mouth daily., Disp: , Rfl:    cetirizine (ZYRTEC) 10 MG tablet, Take 10 mg by mouth daily as needed for allergies., Disp: , Rfl:    denosumab (PROLIA) 60 MG/ML SOSY injection, Inject 60 mg into the skin every 6 (six) months., Disp: , Rfl:    esomeprazole (NEXIUM) 40 MG capsule, Take 40 mg by mouth daily., Disp: , Rfl:    meclizine (ANTIVERT) 12.5 MG tablet, Take 1 tablet (12.5 mg total) by mouth 3 (three) times daily as needed for dizziness., Disp: 15 tablet, Rfl: 0   METAMUCIL FIBER PO, Take by mouth., Disp: , Rfl:    Multiple Vitamin (MULTIVITAMIN) tablet, Take 1 tablet by mouth daily., Disp: , Rfl:    Multiple Vitamins-Minerals (MULTIVITAMIN ADULT, MINERALS,) TABS, Take 1 tablet by mouth daily., Disp: , Rfl:    pantoprazole (PROTONIX) 40 MG tablet, Take 40 mg by mouth daily., Disp: , Rfl:    predniSONE (DELTASONE) 5 MG tablet, Take 1 tablet (5 mg total) by mouth daily with breakfast., Disp: 30 tablet, Rfl: 6   sildenafil (VIAGRA) 100 MG tablet, Take 25 mg by mouth daily as needed for erectile dysfunction., Disp: , Rfl:    tizanidine (ZANAFLEX) 2 MG capsule, Take 2 mg by mouth daily as needed for muscle spasms., Disp: , Rfl:    traZODone (DESYREL) 100 MG tablet, Take 100 mg by mouth at bedtime., Disp: , Rfl:    Vitamin D, Ergocalciferol, (DRISDOL) 1.25 MG  (50000 UNIT) CAPS capsule, Take 50,000 Units by mouth once a week. Wednesday, Disp: , Rfl:    Allergies: Allergies  Allergen Reactions   Codeine Other (See Comments)    "MAKES HIM HYPER"    REVIEW OF SYSTEMS:   Review of Systems  Constitutional:  Positive for fatigue. Negative for chills and fever.  HENT:   Negative for lump/mass, mouth sores, nosebleeds, sore throat and trouble swallowing.   Eyes:  Negative for eye problems.  Respiratory:  Negative for cough and shortness of breath.   Cardiovascular:  Negative for chest pain, leg swelling and palpitations.  Gastrointestinal:  Positive for constipation. Negative for abdominal pain, diarrhea, nausea and vomiting.  Genitourinary:  Negative for bladder incontinence, difficulty urinating, dysuria, frequency, hematuria and nocturia.   Musculoskeletal:  Negative for arthralgias, back pain, flank pain, myalgias and neck pain.  Skin:  Negative for itching and rash.  Neurological:  Positive for dizziness and headaches. Negative for numbness.  Hematological:  Does not bruise/bleed easily.  Psychiatric/Behavioral:  Positive for depression and sleep disturbance. Negative for suicidal ideas. The patient is not nervous/anxious.   All other systems reviewed and are negative.    VITALS:   Blood pressure 126/84, pulse 84, temperature 98.1 F (36.7 C), temperature source Oral, resp. rate 16, weight 181 lb 3.5 oz (82.2 kg), SpO2 97%.  Wt Readings from Last 3 Encounters:  04/25/23 181 lb 3.5 oz (82.2 kg)  02/16/23 186 lb 3.2 oz (84.5 kg)  01/20/23 180 lb (81.6 kg)    Body mass index is 27.55 kg/m.  Performance status (ECOG): 1 - Symptomatic but completely ambulatory  PHYSICAL EXAM:   Physical Exam Vitals and nursing note reviewed. Exam conducted with a chaperone present.  Constitutional:      Appearance:  Normal appearance.  Cardiovascular:     Rate and Rhythm: Normal rate and regular rhythm.     Pulses: Normal pulses.     Heart sounds:  Normal heart sounds.  Pulmonary:     Effort: Pulmonary effort is normal.     Breath sounds: Normal breath sounds.  Abdominal:     Palpations: Abdomen is soft. There is no hepatomegaly, splenomegaly or mass.     Tenderness: There is no abdominal tenderness.  Musculoskeletal:     Right lower leg: No edema.     Left lower leg: No edema.  Lymphadenopathy:     Cervical: No cervical adenopathy.     Right cervical: No superficial, deep or posterior cervical adenopathy.    Left cervical: No superficial, deep or posterior cervical adenopathy.     Upper Body:     Right upper body: No supraclavicular or axillary adenopathy.     Left upper body: No supraclavicular or axillary adenopathy.  Neurological:     General: No focal deficit present.     Mental Status: He is alert and oriented to person, place, and time.  Psychiatric:        Mood and Affect: Mood normal.        Behavior: Behavior normal.     LABS:      Latest Ref Rng & Units 04/25/2023   10:29 AM 02/16/2023   10:44 AM 01/20/2023   12:08 PM  CBC  WBC 4.0 - 10.5 K/uL 4.2  3.6  3.3   Hemoglobin 13.0 - 17.0 g/dL 16.1  09.6  04.5   Hematocrit 39.0 - 52.0 % 39.4  35.3  37.6   Platelets 150 - 400 K/uL 146  155  144       Latest Ref Rng & Units 04/25/2023   10:29 AM 02/16/2023   10:44 AM 01/20/2023   12:08 PM  CMP  Glucose 70 - 99 mg/dL 409  811  914   BUN 8 - 23 mg/dL 24  17  16    Creatinine 0.61 - 1.24 mg/dL 7.82  9.56  2.13   Sodium 135 - 145 mmol/L 135  140  138   Potassium 3.5 - 5.1 mmol/L 4.7  4.0  4.1   Chloride 98 - 111 mmol/L 100  105  108   CO2 22 - 32 mmol/L 27  27  23    Calcium 8.9 - 10.3 mg/dL 9.2  8.8  8.4   Total Protein 6.5 - 8.1 g/dL 6.6  6.3  6.4   Total Bilirubin 0.0 - 1.2 mg/dL 0.8  0.5  0.6   Alkaline Phos 38 - 126 U/L 37  39  45   AST 15 - 41 U/L 16  17  23    ALT 0 - 44 U/L 15  16  23       No results found for: "CEA1", "CEA" / No results found for: "CEA1", "CEA" No results found for: "PSA1" No results  found for: "YQM578" No results found for: "CAN125"  No results found for: "TOTALPROTELP", "ALBUMINELP", "A1GS", "A2GS", "BETS", "BETA2SER", "GAMS", "MSPIKE", "SPEI" No results found for: "TIBC", "FERRITIN", "IRONPCTSAT" No results found for: "LDH"   STUDIES:   No results found.

## 2023-04-25 ENCOUNTER — Inpatient Hospital Stay: Payer: Medicare Other | Attending: Hematology | Admitting: Hematology

## 2023-04-25 ENCOUNTER — Inpatient Hospital Stay: Payer: Medicare Other

## 2023-04-25 VITALS — BP 126/84 | HR 84 | Temp 98.1°F | Resp 16 | Wt 181.2 lb

## 2023-04-25 DIAGNOSIS — Z7952 Long term (current) use of systemic steroids: Secondary | ICD-10-CM | POA: Insufficient documentation

## 2023-04-25 DIAGNOSIS — M810 Age-related osteoporosis without current pathological fracture: Secondary | ICD-10-CM | POA: Diagnosis not present

## 2023-04-25 DIAGNOSIS — Z8042 Family history of malignant neoplasm of prostate: Secondary | ICD-10-CM | POA: Insufficient documentation

## 2023-04-25 DIAGNOSIS — Z808 Family history of malignant neoplasm of other organs or systems: Secondary | ICD-10-CM | POA: Diagnosis not present

## 2023-04-25 DIAGNOSIS — Z87891 Personal history of nicotine dependence: Secondary | ICD-10-CM | POA: Insufficient documentation

## 2023-04-25 DIAGNOSIS — Z923 Personal history of irradiation: Secondary | ICD-10-CM | POA: Insufficient documentation

## 2023-04-25 DIAGNOSIS — Z9079 Acquired absence of other genital organ(s): Secondary | ICD-10-CM | POA: Diagnosis not present

## 2023-04-25 DIAGNOSIS — C7951 Secondary malignant neoplasm of bone: Secondary | ICD-10-CM | POA: Insufficient documentation

## 2023-04-25 DIAGNOSIS — Z803 Family history of malignant neoplasm of breast: Secondary | ICD-10-CM | POA: Diagnosis not present

## 2023-04-25 DIAGNOSIS — C61 Malignant neoplasm of prostate: Secondary | ICD-10-CM | POA: Diagnosis present

## 2023-04-25 DIAGNOSIS — R5383 Other fatigue: Secondary | ICD-10-CM | POA: Insufficient documentation

## 2023-04-25 DIAGNOSIS — C775 Secondary and unspecified malignant neoplasm of intrapelvic lymph nodes: Secondary | ICD-10-CM

## 2023-04-25 DIAGNOSIS — Z806 Family history of leukemia: Secondary | ICD-10-CM | POA: Diagnosis not present

## 2023-04-25 LAB — CBC WITH DIFFERENTIAL/PLATELET
Abs Immature Granulocytes: 0.02 10*3/uL (ref 0.00–0.07)
Basophils Absolute: 0 10*3/uL (ref 0.0–0.1)
Basophils Relative: 0 %
Eosinophils Absolute: 0.1 10*3/uL (ref 0.0–0.5)
Eosinophils Relative: 3 %
HCT: 39.4 % (ref 39.0–52.0)
Hemoglobin: 13.4 g/dL (ref 13.0–17.0)
Immature Granulocytes: 1 %
Lymphocytes Relative: 18 %
Lymphs Abs: 0.8 10*3/uL (ref 0.7–4.0)
MCH: 32.4 pg (ref 26.0–34.0)
MCHC: 34 g/dL (ref 30.0–36.0)
MCV: 95.4 fL (ref 80.0–100.0)
Monocytes Absolute: 0.4 10*3/uL (ref 0.1–1.0)
Monocytes Relative: 9 %
Neutro Abs: 2.9 10*3/uL (ref 1.7–7.7)
Neutrophils Relative %: 69 %
Platelets: 146 10*3/uL — ABNORMAL LOW (ref 150–400)
RBC: 4.13 MIL/uL — ABNORMAL LOW (ref 4.22–5.81)
RDW: 12.8 % (ref 11.5–15.5)
WBC: 4.2 10*3/uL (ref 4.0–10.5)
nRBC: 0 % (ref 0.0–0.2)

## 2023-04-25 LAB — COMPREHENSIVE METABOLIC PANEL
ALT: 15 U/L (ref 0–44)
AST: 16 U/L (ref 15–41)
Albumin: 3.8 g/dL (ref 3.5–5.0)
Alkaline Phosphatase: 37 U/L — ABNORMAL LOW (ref 38–126)
Anion gap: 8 (ref 5–15)
BUN: 24 mg/dL — ABNORMAL HIGH (ref 8–23)
CO2: 27 mmol/L (ref 22–32)
Calcium: 9.2 mg/dL (ref 8.9–10.3)
Chloride: 100 mmol/L (ref 98–111)
Creatinine, Ser: 1.15 mg/dL (ref 0.61–1.24)
GFR, Estimated: >60 mL/min (ref >60)
Glucose, Bld: 106 mg/dL — ABNORMAL HIGH (ref 70–99)
Potassium: 4.7 mmol/L (ref 3.5–5.1)
Sodium: 135 mmol/L (ref 135–145)
Total Bilirubin: 0.8 mg/dL (ref 0.0–1.2)
Total Protein: 6.6 g/dL (ref 6.5–8.1)

## 2023-04-25 LAB — MAGNESIUM: Magnesium: 2.3 mg/dL (ref 1.7–2.4)

## 2023-04-25 NOTE — Progress Notes (Signed)
Patient is taking Zytiga as prescribed. He has not missed any doses and reports no side effects at this time.

## 2023-04-25 NOTE — Patient Instructions (Addendum)
[facility] at [facility] Discharge Instructions   You were seen and examined today by Dr. Ellin Saba.  He reviewed the results of your lab work which are normal/stable.   You are due for your Eligard at the end of April.  Continue Zytiga as prescribed.   We will see you back in April when you get your injection . We will repeat lab work on the day you return.   Return as scheduled.    Thank you for choosing [facility] at [facility] to provide your oncology and hematology care.  To afford each patient quality time with our provider, please arrive at least 15 minutes before your scheduled appointment time.   If you have a lab appointment with the Cancer Center please come in thru the Main Entrance and check in at the main information desk.  You need to re-schedule your appointment should you arrive 10 or more minutes late.  We strive to give you quality time with our providers, and arriving late affects you and other patients whose appointments are after yours.  Also, if you no show three or more times for appointments you may be dismissed from the clinic at the providers discretion.     Again, thank you for choosing [facility].  Our hope is that these requests will decrease the amount of time that you wait before being seen by our physicians.       _____________________________________________________________  Should you have questions after your visit to [facility], please contact our office at [phone] and follow the prompts.  Our office hours are 8:00 a.m. and 4:30 p.m. Monday - Friday.  Please note that voicemails left after 4:00 p.m. may not be returned until the following business day.  We are closed weekends and major holidays.  You do have access to a nurse 24-7, just call the main number to the clinic [phone] and do not press any options, hold on the line and a nurse will answer the phone.    For  prescription refill requests, have your pharmacy contact our office and allow 72 hours.    Due to Covid, you will need to wear a mask upon entering the hospital. If you do not have a mask, a mask will be given to you at the Main Entrance upon arrival. For doctor visits, patients may have 1 support person age 4 or older with them. For treatment visits, patients can not have anyone with them due to social distancing guidelines and our immunocompromised population.

## 2023-04-26 LAB — PSA: Prostatic Specific Antigen: 0.84 ng/mL (ref 0.00–4.00)

## 2023-04-27 ENCOUNTER — Other Ambulatory Visit: Payer: Self-pay

## 2023-05-01 ENCOUNTER — Other Ambulatory Visit: Payer: Self-pay

## 2023-05-02 ENCOUNTER — Other Ambulatory Visit: Payer: Self-pay

## 2023-05-02 ENCOUNTER — Other Ambulatory Visit (HOSPITAL_COMMUNITY): Payer: Self-pay

## 2023-05-02 NOTE — Progress Notes (Signed)
 Specialty Pharmacy Refill Coordination Note  Brendan Anderson is a 68 y.o. male contacted today regarding refills of specialty medication(s) Abiraterone Acetate [name]   Patient requested Delivery   Delivery date: 05/08/23   Verified address: [address]   [place] [address]   Medication will be filled on 05/09/23.

## 2023-06-05 ENCOUNTER — Other Ambulatory Visit: Payer: Self-pay

## 2023-06-05 NOTE — Progress Notes (Signed)
 Specialty Pharmacy Refill Coordination Note  Brendan Anderson is a 68 y.o. male contacted today regarding refills of specialty medication(s) Abiraterone Acetate [name]   Patient requested Delivery   Delivery date: 06/07/23   Verified address: [address]   [place] [address]   Medication will be filled on 06/06/23.

## 2023-06-06 ENCOUNTER — Other Ambulatory Visit: Payer: Self-pay

## 2023-07-03 ENCOUNTER — Other Ambulatory Visit (HOSPITAL_COMMUNITY): Payer: Self-pay

## 2023-07-03 NOTE — Progress Notes (Signed)
 Specialty Pharmacy Refill Coordination Note  Brendan Anderson is a 68 y.o. male contacted today regarding refills of specialty medication(s) Abiraterone  Acetate (ZYTIGA )   Patient requested Delivery   Delivery date: 07/07/23   Verified address: [address]   [place] [address]   Medication will be filled on 07/06/23.

## 2023-07-03 NOTE — Progress Notes (Signed)
 Specialty Pharmacy Ongoing Clinical Assessment Note  Brendan Anderson is a 68 y.o. male who is being followed by the specialty pharmacy service for RxSp Oncology   Patient's specialty medication(s) reviewed today: Abiraterone  Acetate (ZYTIGA )   Missed doses in the last 4 weeks: 0   Patient/Caregiver did not have any additional questions or concerns.   Therapeutic benefit summary: Patient is achieving benefit   Adverse events/side effects summary: Experienced adverse events/side effects (patient reports fatigue and occasional mild nausea, both are tolerable at this time)   Patient's therapy is appropriate to: Continue    Goals Addressed             This Visit's Progress    Slow Disease Progression   On track    Patient is on track. Patient will maintain adherence. PSA remains improved at 0.84 ng/ml as of 04/25/23.          Follow up:  3 months  Malachi Screws Specialty Pharmacist

## 2023-07-06 ENCOUNTER — Other Ambulatory Visit: Payer: Self-pay

## 2023-07-08 IMAGING — CT NM PET TUM IMG SKULL BASE T - THIGH
7 series · 25 of 25 positions shown · non-contrast
Comparison: CT abdomen 05/28/2020

CLINICAL DATA: Prostate carcinoma with biochemical recurrence.
Prior radical prostatectomy and salvage radiation therapy. PSA equal
2.04

EXAM:
NUCLEAR MEDICINE PET SKULL BASE TO THIGH
TECHNIQUE: 9.38 mCi F18 Piflufolastat (Pylarify) was injected intravenously.
Full-ring PET imaging was performed from the skull base to thigh
after the radiotracer. CT data was obtained and used for attenuation
correction and anatomic localization.

[Series 3: pet sk_thigh ac · axial · 5.0mm · 4.07mm/px · z∈[-783,+233]mm · 6 of 255 slices shown]
[im 1/255]
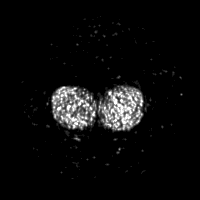
[im 51/255]
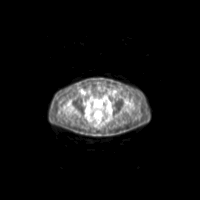
[im 102/255]
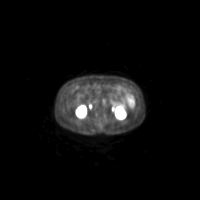
[im 153/255]
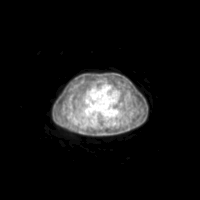
[im 204/255]
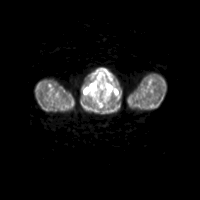
[im 255/255]
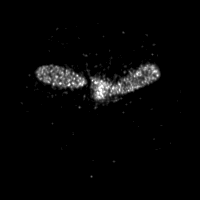

[Series 4: ct sk_thigh 5.0 br38 · axial · 5.0mm · 0.98mm/px · z∈[-783,+233]mm · 6 of 255 slices shown]
[im 1/255]
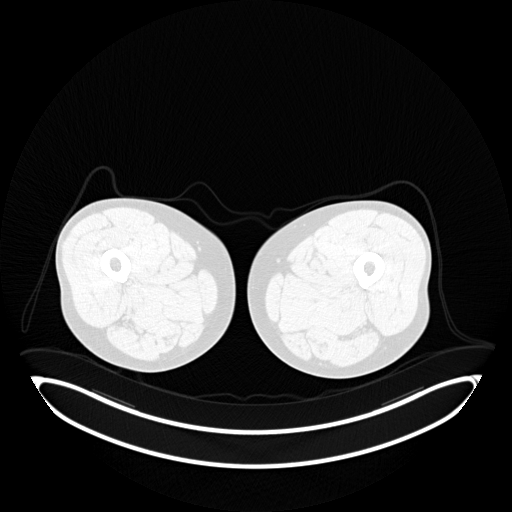
[im 51/255]
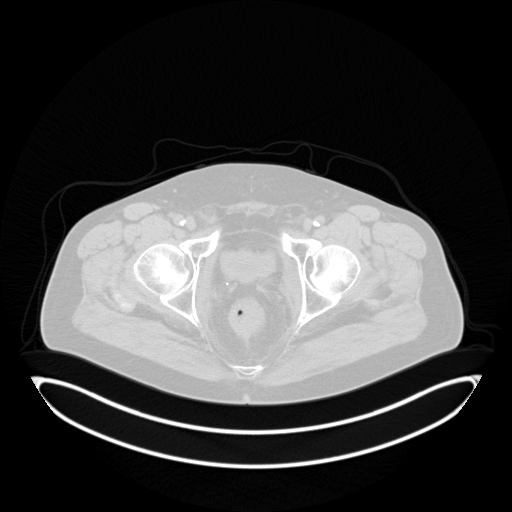
[im 102/255]
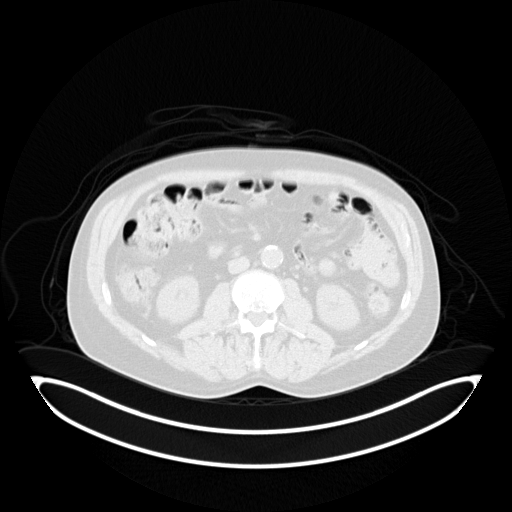
[im 153/255]
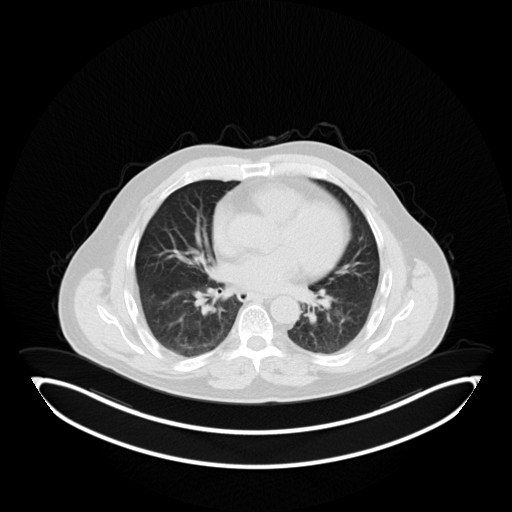
[im 204/255  brain]
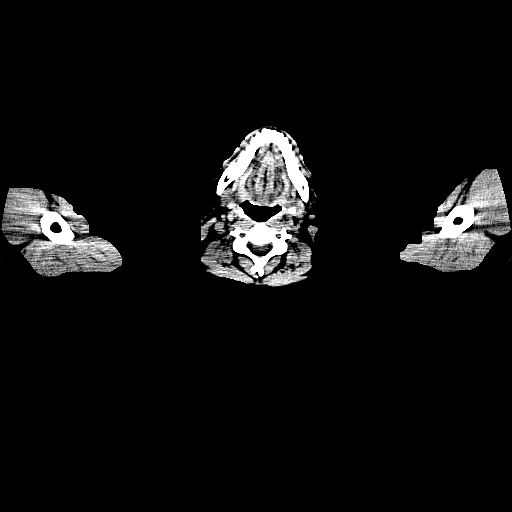
[im 255/255]
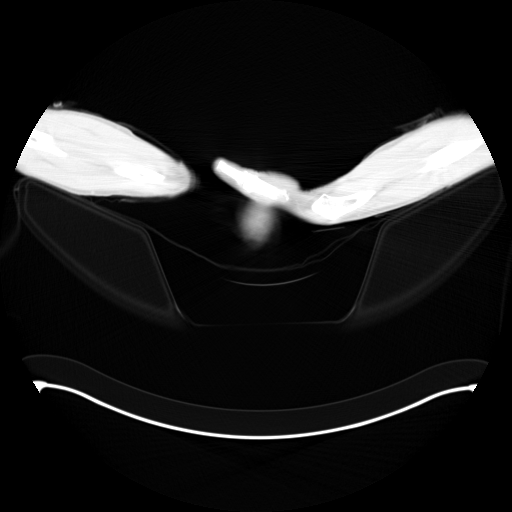

[Series 5: pet sk_thigh nac · axial · 5.0mm · 4.07mm/px · z∈[-783,+233]mm · 5 of 255 slices shown]
[im 1/255]
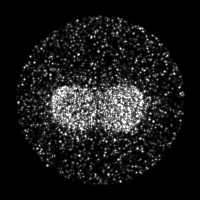
[im 64/255]
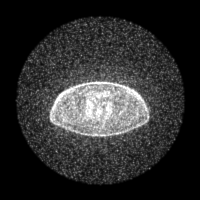
[im 128/255]
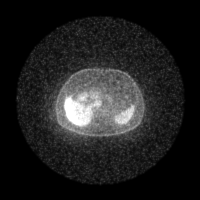
[im 191/255]
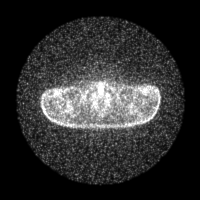
[im 255/255]
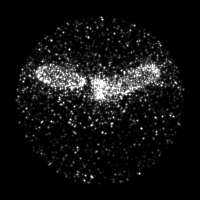

[Series 7: ct 5.0 bl57 lung_bone · axial · 5.0mm · 0.66mm/px · 1 of 64 slices shown]
[im 1/64]
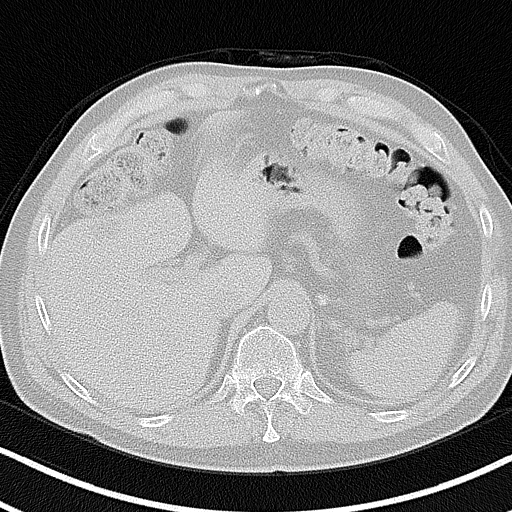

[Series 603: fused tra · 5 of 247 slices shown]
[im 1/247]
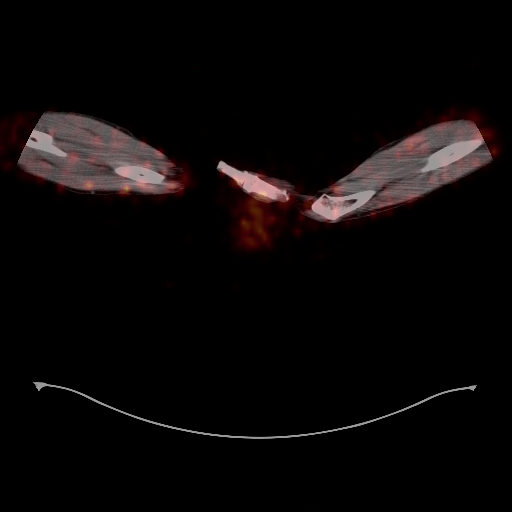
[im 62/247]
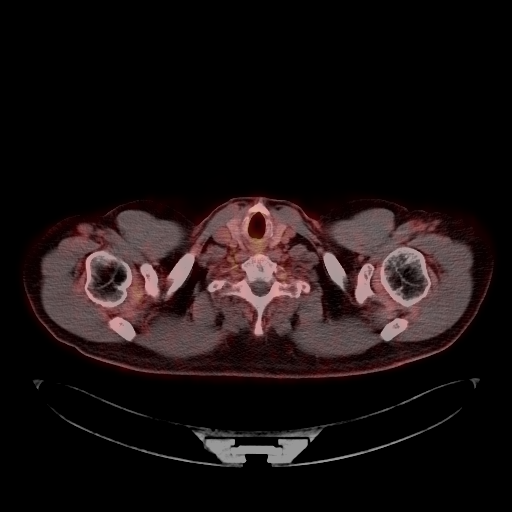
[im 124/247]
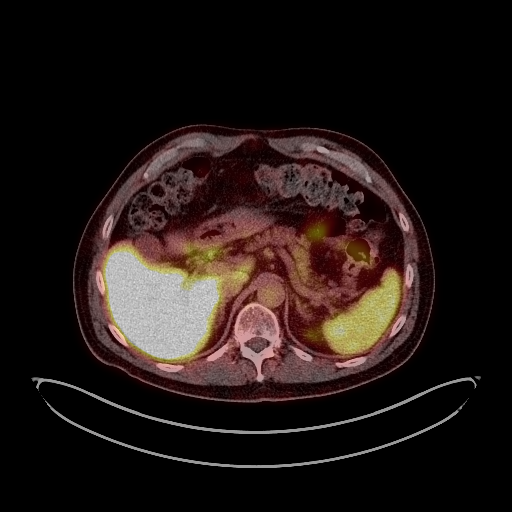
[im 185/247]
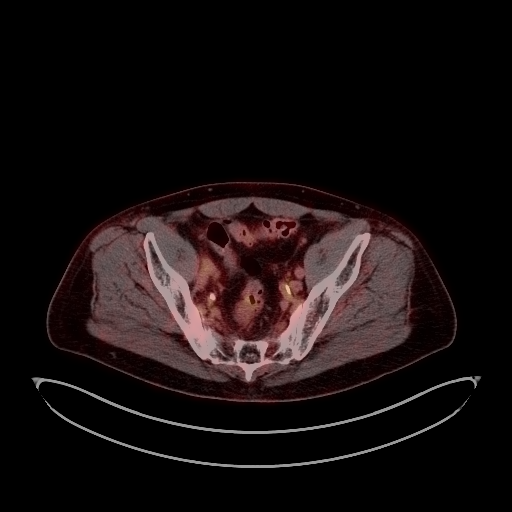
[im 247/247]
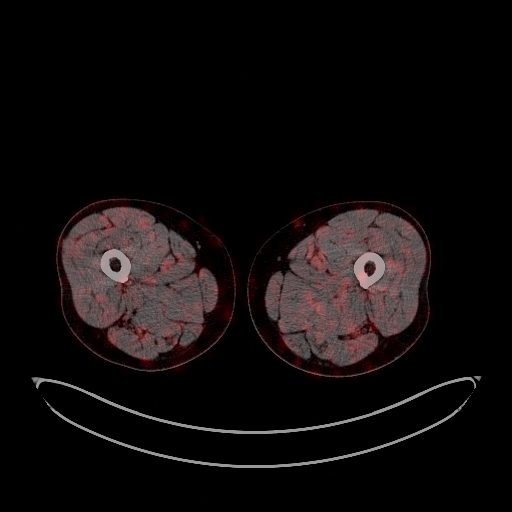

[Series 604: fused cor · 1 of 66 slices shown]
[im 1/66]
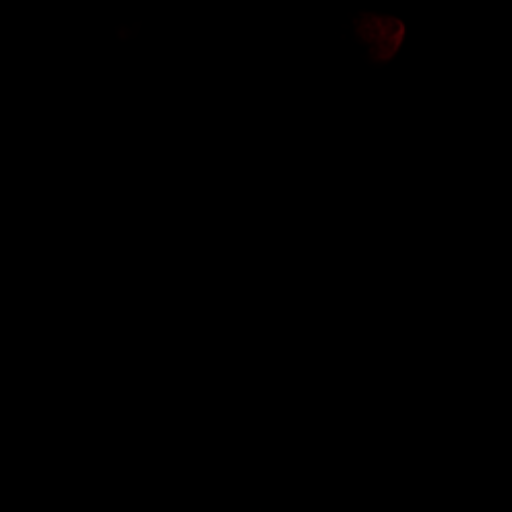

[Series 605: mip pet · coronal · 2.11mm/px · 1 of 32 slices shown]
[im 1/32]
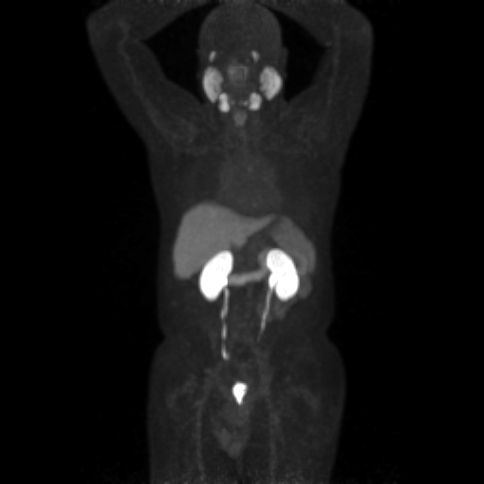

[25 of 25 positions shown; findings below may reference images not displayed]

FINDINGS: NECK

No radiotracer activity in neck lymph nodes.

Incidental CT finding: None

CHEST

Two small radiotracer avid lymph nodes in the LEFT aspect of the
medial mediastinum along the descending thoracic aorta. More
superior node at in level of the LEFT mainstem bronchus with SUV max
equal 3.1 on image 89.

More inferior node position between the esophagus and aorta on image
108 measures 2-3 mm with SUV max equal 3.1.

No suspicious pulmonary nodules.

Incidental CT finding: None

ABDOMEN/PELVIS

Prostate: No focal activity in the prostatectomy bed.

Lymph nodes: No abnormal radiotracer accumulation within pelvic or
abdominal nodes.

Liver: No evidence of liver metastasis

Incidental CT finding: Atherosclerotic calcification of the aorta.

SKELETON

No focal  activity to suggest skeletal metastasis.
IMPRESSION: 1. No evidence local prostate cancer recurrence in the prostatectomy
bed.
2. Two very small presumed foci of nodal tissue in the LEFT middle
mediastinum along the descending thoracic aorta have radiotracer
activity. Findings concerning for nodal prostate cancer metastasis.
3. No evidence of local nodal metastasis in the pelvis.
4. No evidence of visceral metastasis or skeletal metastasis.

## 2023-07-10 ENCOUNTER — Other Ambulatory Visit: Payer: Self-pay

## 2023-07-10 DIAGNOSIS — C61 Malignant neoplasm of prostate: Secondary | ICD-10-CM

## 2023-07-11 ENCOUNTER — Inpatient Hospital Stay: Payer: Medicare Other | Attending: Hematology | Admitting: Hematology

## 2023-07-11 ENCOUNTER — Inpatient Hospital Stay: Payer: Medicare Other

## 2023-07-11 VITALS — Wt 183.9 lb

## 2023-07-11 DIAGNOSIS — C775 Secondary and unspecified malignant neoplasm of intrapelvic lymph nodes: Secondary | ICD-10-CM | POA: Insufficient documentation

## 2023-07-11 DIAGNOSIS — Z923 Personal history of irradiation: Secondary | ICD-10-CM | POA: Diagnosis not present

## 2023-07-11 DIAGNOSIS — Z803 Family history of malignant neoplasm of breast: Secondary | ICD-10-CM | POA: Insufficient documentation

## 2023-07-11 DIAGNOSIS — M810 Age-related osteoporosis without current pathological fracture: Secondary | ICD-10-CM | POA: Insufficient documentation

## 2023-07-11 DIAGNOSIS — R5383 Other fatigue: Secondary | ICD-10-CM | POA: Insufficient documentation

## 2023-07-11 DIAGNOSIS — Z9079 Acquired absence of other genital organ(s): Secondary | ICD-10-CM | POA: Diagnosis not present

## 2023-07-11 DIAGNOSIS — Z8042 Family history of malignant neoplasm of prostate: Secondary | ICD-10-CM | POA: Diagnosis not present

## 2023-07-11 DIAGNOSIS — C61 Malignant neoplasm of prostate: Secondary | ICD-10-CM

## 2023-07-11 DIAGNOSIS — Z806 Family history of leukemia: Secondary | ICD-10-CM | POA: Diagnosis not present

## 2023-07-11 DIAGNOSIS — Z87891 Personal history of nicotine dependence: Secondary | ICD-10-CM | POA: Insufficient documentation

## 2023-07-11 DIAGNOSIS — C7951 Secondary malignant neoplasm of bone: Secondary | ICD-10-CM | POA: Insufficient documentation

## 2023-07-11 DIAGNOSIS — Z808 Family history of malignant neoplasm of other organs or systems: Secondary | ICD-10-CM | POA: Diagnosis not present

## 2023-07-11 DIAGNOSIS — Z7952 Long term (current) use of systemic steroids: Secondary | ICD-10-CM | POA: Diagnosis not present

## 2023-07-11 LAB — COMPREHENSIVE METABOLIC PANEL WITH GFR
ALT: 14 U/L (ref 0–44)
AST: 16 U/L (ref 15–41)
Albumin: 3.8 g/dL (ref 3.5–5.0)
Alkaline Phosphatase: 43 U/L (ref 38–126)
Anion gap: 7 (ref 5–15)
BUN: 18 mg/dL (ref 8–23)
CO2: 25 mmol/L (ref 22–32)
Calcium: 9.2 mg/dL (ref 8.9–10.3)
Chloride: 103 mmol/L (ref 98–111)
Creatinine, Ser: 1.2 mg/dL (ref 0.61–1.24)
GFR, Estimated: >60 mL/min (ref >60)
Glucose, Bld: 109 mg/dL — ABNORMAL HIGH (ref 70–99)
Potassium: 4.1 mmol/L (ref 3.5–5.1)
Sodium: 135 mmol/L (ref 135–145)
Total Bilirubin: 0.6 mg/dL (ref 0.0–1.2)
Total Protein: 6.5 g/dL (ref 6.5–8.1)

## 2023-07-11 LAB — CBC WITH DIFFERENTIAL/PLATELET
Abs Immature Granulocytes: 0.01 10*3/uL (ref 0.00–0.07)
Basophils Absolute: 0 10*3/uL (ref 0.0–0.1)
Basophils Relative: 1 %
Eosinophils Absolute: 0.2 10*3/uL (ref 0.0–0.5)
Eosinophils Relative: 7 %
HCT: 36.6 % — ABNORMAL LOW (ref 39.0–52.0)
Hemoglobin: 12.6 g/dL — ABNORMAL LOW (ref 13.0–17.0)
Immature Granulocytes: 0 %
Lymphocytes Relative: 20 %
Lymphs Abs: 0.6 10*3/uL — ABNORMAL LOW (ref 0.7–4.0)
MCH: 32.1 pg (ref 26.0–34.0)
MCHC: 34.4 g/dL (ref 30.0–36.0)
MCV: 93.4 fL (ref 80.0–100.0)
Monocytes Absolute: 0.4 10*3/uL (ref 0.1–1.0)
Monocytes Relative: 12 %
Neutro Abs: 1.9 10*3/uL (ref 1.7–7.7)
Neutrophils Relative %: 60 %
Platelets: 161 10*3/uL (ref 150–400)
RBC: 3.92 MIL/uL — ABNORMAL LOW (ref 4.22–5.81)
RDW: 12.2 % (ref 11.5–15.5)
WBC: 3.1 10*3/uL — ABNORMAL LOW (ref 4.0–10.5)
nRBC: 0 % (ref 0.0–0.2)

## 2023-07-11 LAB — PSA: Prostatic Specific Antigen: 0.17 ng/mL (ref 0.00–4.00)

## 2023-07-11 LAB — MAGNESIUM: Magnesium: 2.3 mg/dL (ref 1.7–2.4)

## 2023-07-11 MED ORDER — LEUPROLIDE ACETATE (6 MONTH) 45 MG ~~LOC~~ KIT
45.0000 mg | PACK | Freq: Once | SUBCUTANEOUS | Status: AC
  Administered 2023-07-11: 45 mg via SUBCUTANEOUS
  Filled 2023-07-11: qty 45

## 2023-07-11 NOTE — Patient Instructions (Signed)
 [facility] at [facility] Discharge Instructions   You were seen and examined today by Dr. Cheree Cords.  He reviewed the results of your lab work which are normal/stable. Your PSA results are pending.   Continue Zytiga  as prescribed.   We will proceed with your Eligard  today.   Return as scheduled.    Thank you for choosing [facility] at [facility] to provide your oncology and hematology care.  To afford each patient quality time with our provider, please arrive at least 15 minutes before your scheduled appointment time.   If you have a lab appointment with the Cancer Center please come in thru the Main Entrance and check in at the main information desk.  You need to re-schedule your appointment should you arrive 10 or more minutes late.  We strive to give you quality time with our providers, and arriving late affects you and other patients whose appointments are after yours.  Also, if you no show three or more times for appointments you may be dismissed from the clinic at the providers discretion.     Again, thank you for choosing [facility].  Our hope is that these requests will decrease the amount of time that you wait before being seen by our physicians.       _____________________________________________________________  Should you have questions after your visit to [facility], please contact our office at [phone] and follow the prompts.  Our office hours are 8:00 a.m. and 4:30 p.m. Monday - Friday.  Please note that voicemails left after 4:00 p.m. may not be returned until the following business day.  We are closed weekends and major holidays.  You do have access to a nurse 24-7, just call the main number to the clinic [phone] and do not press any options, hold on the line and a nurse will answer the phone.    For prescription refill requests, have your pharmacy contact our office and allow 72 hours.     Due to Covid, you will need to wear a mask upon entering the hospital. If you do not have a mask, a mask will be given to you at the Main Entrance upon arrival. For doctor visits, patients may have 1 support person age 7 or older with them. For treatment visits, patients can not have anyone with them due to social distancing guidelines and our immunocompromised population.

## 2023-07-11 NOTE — Progress Notes (Signed)
 [facility] [address], [place] [address]    Clinic Day:  07/11/2023  Referring physician: Lenda Quan, MD  Patient Care Team: Lenda Quan, MD as PCP - General (Internal Medicine) Judyth Nunnery, RN as Registered Nurse (Medical Oncology) Paulett Boros, MD as Medical Oncologist (Medical Oncology) Gerhard Knuckles, RN as Oncology Nurse Navigator (Medical Oncology)   ASSESSMENT & PLAN:   Assessment:   1.  Stage IV (pT3b pN1 M1) prostate cancer: - Prostate biopsy (04/24/2019): Prostatic adenocarcinoma, Gleason's 4+5=9, done in [place] by Dr. Lowry Rued.  PSA was 6.3. - Radical prostatectomy and lymph node resection (06/27/2019) - Pathology: Prostatic adenocarcinoma, Gleason 5+4=9 (grade group 5).  Extraprostatic extension Anderson present at the right posterior mid, bladder neck and bilateral posterior base, bilateral seminal vesicles invasion present (pT3b).  LVI present.  Margins negative.  Metastatic adenocarcinoma in 1/4 lymph nodes in the right pelvis.  0/5 lymph nodes in the left pelvis.  PT3BPN1. - Postsurgery PSA of 0.2.  10/29/2019 PSA 0.38. - XRT to prostate fossa, pelvic lymph nodes from 12/02/2019 - 01/23/2020 with short-term ADT - PSA initially became undetectable, but quickly began increasing and was noted to have biochemical recurrence in November 2022, PSA 0.46.  PSA increased to 2.04 in February 2023. - PSMA PET scan (07/05/2021): 2 very small presumed foci of nodal tissue in the left middle mediastinum along the descending thoracic aorta concerning for nodal prostate cancer metastasis.  No evidence of local recurrence in the prostatectomy bed or nodal metastasis in the pelvis.  No evidence of visceral or skeletal metastasis. - Germline mutation testing ([facility]) negative on 12/11/2019 - SBRT to the PET positive intrathoracic lymph nodes, 50 Gray in 5 fractions from 08/16/2021 through 08/27/2021. - PSA: 2.26 (10/22/2021), 2.04 (06/08/2021), 0.46 (02/09/2021), 0.17  (11/05/2020), <0.015 (04/22/2020), 0.38 (10/29/2019), 0.20 (09/19/2019). - Total testosterone  427 (11/04/2020) - PSMA PET scan (10/16/2022): New small focus of activity within the central and 1 vertebral body and within the left transverse process of the T11 vertebral body.  No evidence of cancer in the prostate bed or adenopathy. - Status post XRT 50 Gray in 5 fractions completed on 11/22/2022 - PSA increased to 12.8 on 12/15/2022.  Brendan Anderson Anderson a candidate for triplet therapy based on Gleason 9 (definition of "high risk" disease as used in LATITUDE trial).  However because of oligometastatic disease, we decided on ADT and abiraterone . - ADT with abiraterone  started on 12/15/2022   2.  Social/family history: - Brendan Anderson Anderson married and lives with his wife.  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  Brendan Anderson had very little exposure to asbestos.  No other chemical exposure. - Brendan Anderson quit smoking in 1985. - Mother had breast cancer in her early 65s and died with acute leukemia few months after receiving chemotherapy for breast cancer.  Plan:  1.  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  Last Eligard  injection was on 01/10/2023, 45 mg. - Brendan Anderson reports fatigue since the start of ADT.  Rarely has nausea however denies any vomiting. - Brendan Anderson was started on steroid pack yesterday for back pain by his orthopedics doctor. - Labs from 07/11/2023: Normal LFTs.  Potassium was normal at 4.1.  Mild leukopenia and mild anemia noted.  Last PSA was 0.84.  PSA from today Anderson pending which we will follow-up on. - Brendan Anderson may proceed with Eligard  45 mg today.  RTC 12 weeks for follow-up with repeat PSA, testosterone  and  routine labs.    2.  Osteoporosis: - Continue Prolia injections every 6 months with Dr. Daina Drum in [place].  Continue calcium and vitamin D supplements.  Orders Placed This Encounter  Procedures   CBC with Differential    Standing Status:   Future    Expected Date:   10/09/2023     Expiration Date:   07/10/2024   Comprehensive metabolic panel    Standing Status:   Future    Expected Date:   10/09/2023    Expiration Date:   07/10/2024   PSA    Standing Status:   Future    Expected Date:   10/09/2023    Expiration Date:   07/10/2024   Testosterone     Standing Status:   Future    Expected Date:   10/09/2023    Expiration Date:   07/10/2024      [name],acting as a scribe for Paulett Boros, MD.,have documented all relevant documentation on the behalf of Paulett Boros, MD,as directed by  Paulett Boros, MD while in the presence of Paulett Boros, MD.  I, Paulett Boros MD, have reviewed the above documentation for accuracy and completeness, and I agree with the above.     Paulett Boros, MD   4/29/20252:48 PM  CHIEF COMPLAINT:   Diagnosis: Prostate cancer metastatic to intrapelvic lymph node    Cancer Staging  No matching staging information was found for the patient.    Prior Therapy:  Radical prostatectomy and BPL ND on 06/27/2019  XRT to prostate fossa, pelvic lymph nodes from 12/02/2019 - 01/23/2020 with short-term ADT  SBRT to the mediastinal lymph nodes, 50 Gray in 5 fractions from 08/16/2021 through 08/27/2021  Current Therapy: ADT and abiraterone /prednisone    HISTORY OF PRESENT ILLNESS:   Oncology History  Prostate cancer metastatic to intrapelvic lymph node (HCC)  08/07/2018 Initial Diagnosis   Prostate cancer metastatic to intrapelvic lymph node (HCC)   12/19/2019 Genetic Testing   Negative genetic testing on the multicancer panel.  The Multi-Gene Panel offered by [facility] includes sequencing and/or deletion duplication testing of the following 85 genes: AIP, ALK, APC, ATM, AXIN2,BAP1,  BARD1, BLM, BMPR1A, BRCA1, BRCA2, BRIP1, CASR, CDC73, CDH1, CDK4, CDKN1B, CDKN1C, CDKN2A (p14ARF), CDKN2A (p16INK4a), CEBPA, CHEK2, CTNNA1, DICER1, DIS3L2, EGFR (c.2369C>T, p.Thr790Met variant only), EPCAM (Deletion/duplication  testing only), FH, FLCN, GATA2, GPC3, GREM1 (Promoter region deletion/duplication testing only), HOXB13 (c.251G>A, p.Gly84Glu), HRAS, KIT, MAX, MEN1, MET, MITF (c.952G>A, p.Glu318Lys variant only), MLH1, MSH2, MSH3, MSH6, MUTYH, NBN, NF1, NF2, NTHL1, PALB2, PDGFRA, PHOX2B, PMS2, POLD1, POLE, POT1, PRKAR1A, PTCH1, PTEN, RAD50, RAD51C, RAD51D, RB1, RECQL4, RET, RNF43, RUNX1, SDHAF2, SDHA (sequence changes only), SDHB, SDHC, SDHD, SMAD4, SMARCA4, SMARCB1, SMARCE1, STK11, SUFU, TERC, TERT, TMEM127, TP53, TSC1, TSC2, VHL, WRN and WT1.  The report date Anderson December 19, 2019.      INTERVAL HISTORY:   Brendan Anderson a 68 y.o. male presenting to clinic today for follow up of Castration sensitive metastatic prostate cancer. Brendan Anderson was last seen by me on 04/25/23.  Today, Brendan Anderson states that Brendan Anderson Anderson doing well overall. His appetite level Anderson at 100%. His energy level Anderson at 50%. Brendan Anderson Anderson accompanied by his wife.   Brendan Anderson notes fatigue and occasional nausea. Fatigue began after starting Zytiga . Brendan Anderson Anderson tolerating Zytiga  well and taking prednisone  as prescribed. Brendan Anderson was prescribed a Z-pack yesterday by his orthopedist for back pain that began after radiation therapy. Brendan Anderson has a history of a broken tailbone and and 2 vertebrates due to osteopenia, which  Anderson likely what Anderson contributing to back pain. Brendan Anderson has follow-up with orthopedics. Brendan Anderson Anderson receiving Prolia injections with Dr. Rojean Cleaves.   His wife notes Brendan Anderson has SOB when walking up stairs.   PAST MEDICAL HISTORY:   Past Medical History: Past Medical History:  Diagnosis Date   Anxiety    Arthritis    hip knees wrist   Depression    Family history of breast cancer    Family history of prostate cancer    GERD (gastroesophageal reflux disease)    Osteoporosis    Prostate cancer [facility])    Skin cancer     Surgical History: Past Surgical History:  Procedure Laterality Date   BICEPS TENDON REPAIR Left 07/2016   EYE SURGERY Bilateral 2005   LYMPHADENECTOMY Bilateral 06/27/2019    Procedure: LYMPHADENECTOMY, PELVIC;  Surgeon: Florencio Hunting, MD;  Location: [facility];  Service: Urology;  Laterality: Bilateral;   MOLE REMOVAL  2008   pre CA from face   NASAL SINUS SURGERY  2010   ROBOT ASSISTED LAPAROSCOPIC RADICAL PROSTATECTOMY N/A 06/27/2019   Procedure: XI ROBOTIC ASSISTED LAPAROSCOPIC RADICAL PROSTATECTOMY LEVEL 2;  Surgeon: Florencio Hunting, MD;  Location: [facility];  Service: Urology;  Laterality: N/A;   SHOULDER ARTHROSCOPY WITH ROTATOR CUFF REPAIR Left 11/2016    Social History: Social History   Socioeconomic History   Marital status: Married    Spouse name: Tanya Fantasia   Number of children: 2   Years of education: Not on file   Highest education level: Not on file  Occupational History   Occupation: retired    Comment: Garment/textile technologist  Tobacco Use   Smoking status: Former    Current packs/day: 0.00    Average packs/day: 0.3 packs/day for 10.0 years (2.5 ttl pk-yrs)    Types: Cigars, Cigarettes    Start date: 06/18/1973    Quit date: 06/19/1983    Years since quitting: 40.0   Smokeless tobacco: Never  Vaping Use   Vaping status: Never Used  Substance and Sexual Activity   Alcohol use: Yes    Comment: rare   Drug use: Never   Sexual activity: Not Currently  Other Topics Concern   Not on file  Social History Narrative   Not on file   Social Drivers of Health   Financial Resource Strain: Not on file  Food Insecurity: No Food Insecurity (11/03/2022)   Hunger Vital Sign    Worried About Running Out of Food in the Last Year: Never true    Ran Out of Food in the Last Year: Never true  Transportation Needs: No Transportation Needs (11/03/2022)   PRAPARE - Administrator, Civil Service (Medical): No    Lack of Transportation (Non-Medical): No  Physical Activity: Not on file  Stress: Not on file  Social Connections: Not on file  Intimate Partner Violence: Not At Risk (11/03/2022)   Humiliation, Afraid, Rape, and Kick questionnaire     Fear of Current or Ex-Partner: No    Emotionally Abused: No    Physically Abused: No    Sexually Abused: No    Family History: Family History  Problem Relation Age of Onset   Leukemia Mother    Breast cancer Mother 49   Cancer Father        NOS   Breast cancer Paternal Aunt        dx in her 39s   Brain cancer Paternal Uncle        d. 48s  Heart attack Maternal Grandmother    Stroke Maternal Grandfather    Asthma Paternal Grandmother    Prostate cancer Paternal Grandfather    Cancer Nephew 10       brother's grandson - NOS   Colon cancer Neg Hx    Pancreatic cancer Neg Hx     Current Medications:  Current Outpatient Medications:    abiraterone  acetate (ZYTIGA ) 250 MG tablet, Take 4 tablets (1,000 mg total) by mouth daily. Take on an empty stomach 1 hour before or 2 hours after a meal, Disp: 120 tablet, Rfl: 3   Al Hyd-Mg Tr-Alg Ac-Sod Bicarb (GAVISCON-2 PO), Take 2 tablets by mouth at bedtime., Disp: , Rfl:    ascorbic acid (VITAMIN C) 250 MG tablet, Take 500 mg by mouth daily at 12 noon., Disp: , Rfl:    Calcium Carbonate Antacid 400 MG CHEW, Chew 800 mg by mouth 2 (two) times daily., Disp: , Rfl:    celecoxib  (CELEBREX ) 200 MG capsule, Take 200 mg by mouth daily., Disp: , Rfl:    cetirizine (ZYRTEC) 10 MG tablet, Take 10 mg by mouth daily as needed for allergies., Disp: , Rfl:    cyclobenzaprine (FLEXERIL) 5 MG tablet, Take 5 mg by mouth at bedtime as needed., Disp: , Rfl:    denosumab (PROLIA) 60 MG/ML SOSY injection, Inject 60 mg into the skin every 6 (six) months., Disp: , Rfl:    esomeprazole (NEXIUM) 40 MG capsule, Take 40 mg by mouth daily., Disp: , Rfl:    meclizine  (ANTIVERT ) 12.5 MG tablet, Take 1 tablet (12.5 mg total) by mouth 3 (three) times daily as needed for dizziness., Disp: 15 tablet, Rfl: 0   METAMUCIL FIBER PO, Take by mouth., Disp: , Rfl:    Multiple Vitamin (MULTIVITAMIN) tablet, Take 1 tablet by mouth daily., Disp: , Rfl:    Multiple Vitamins-Minerals  (MULTIVITAMIN ADULT, MINERALS,) TABS, Take 1 tablet by mouth daily., Disp: , Rfl:    pantoprazole  (PROTONIX ) 40 MG tablet, Take 40 mg by mouth daily., Disp: , Rfl:    predniSONE  (DELTASONE ) 5 MG tablet, Take 1 tablet (5 mg total) by mouth daily with breakfast., Disp: 30 tablet, Rfl: 6   predniSONE  (STERAPRED UNI-PAK 21 TAB) 10 MG (21) TBPK tablet, SMARTSIG:- Tablet(s) By Mouth -, Disp: , Rfl:    tizanidine (ZANAFLEX) 2 MG capsule, Take 2 mg by mouth daily as needed for muscle spasms., Disp: , Rfl:    traZODone  (DESYREL ) 100 MG tablet, Take 100 mg by mouth at bedtime., Disp: , Rfl:    Vitamin D, Ergocalciferol, (DRISDOL) 1.25 MG (50000 UNIT) CAPS capsule, Take 50,000 Units by mouth once a week. Wednesday, Disp: , Rfl:    Allergies: Allergies  Allergen Reactions   Codeine Other (See Comments)    "MAKES HIM HYPER"    REVIEW OF SYSTEMS:   Review of Systems  Constitutional:  Positive for fatigue. Negative for chills and fever.  HENT:   Negative for lump/mass, mouth sores, nosebleeds, sore throat and trouble swallowing.   Eyes:  Negative for eye problems.  Respiratory:  Positive for shortness of breath (with exertion). Negative for cough.   Cardiovascular:  Negative for chest pain, leg swelling and palpitations.  Gastrointestinal:  Positive for nausea. Negative for abdominal pain, constipation, diarrhea and vomiting.  Genitourinary:  Positive for hematuria. Negative for bladder incontinence, difficulty urinating, dysuria, frequency and nocturia.   Musculoskeletal:  Negative for arthralgias, back pain, flank pain, myalgias and neck pain.  Skin:  Negative for itching  and rash.  Neurological:  Positive for dizziness and headaches. Negative for numbness.  Hematological:  Does not bruise/bleed easily.  Psychiatric/Behavioral:  Positive for sleep disturbance. Negative for depression and suicidal ideas. The patient Anderson not nervous/anxious.   All other systems reviewed and are negative.    VITALS:    Weight 183 lb 13.8 oz (83.4 kg).  Wt Readings from Last 3 Encounters:  07/11/23 183 lb 13.8 oz (83.4 kg)  04/25/23 181 lb 3.5 oz (82.2 kg)  02/16/23 186 lb 3.2 oz (84.5 kg)    Body mass index Anderson 27.96 kg/m.  Performance status (ECOG): 1 - Symptomatic but completely ambulatory  PHYSICAL EXAM:   Physical Exam Vitals and nursing note reviewed. Exam conducted with a chaperone present.  Constitutional:      Appearance: Normal appearance.  Cardiovascular:     Rate and Rhythm: Normal rate and regular rhythm.     Pulses: Normal pulses.     Heart sounds: Normal heart sounds.  Pulmonary:     Effort: Pulmonary effort Anderson normal.     Breath sounds: Normal breath sounds.  Abdominal:     Palpations: Abdomen Anderson soft. There Anderson no hepatomegaly, splenomegaly or mass.     Tenderness: There Anderson no abdominal tenderness.  Musculoskeletal:     Right lower leg: No edema.     Left lower leg: No edema.  Lymphadenopathy:     Cervical: No cervical adenopathy.     Right cervical: No superficial, deep or posterior cervical adenopathy.    Left cervical: No superficial, deep or posterior cervical adenopathy.     Upper Body:     Right upper body: No supraclavicular or axillary adenopathy.     Left upper body: No supraclavicular or axillary adenopathy.  Neurological:     General: No focal deficit present.     Mental Status: Brendan Anderson Anderson alert and oriented to person, place, and time.  Psychiatric:        Mood and Affect: Mood normal.        Behavior: Behavior normal.     LABS:      Latest Ref Rng & Units 07/11/2023    9:59 AM 04/25/2023   10:29 AM 02/16/2023   10:44 AM  CBC  WBC 4.0 - 10.5 K/uL 3.1  4.2  3.6   Hemoglobin 13.0 - 17.0 g/dL 16.1  09.6  04.5   Hematocrit 39.0 - 52.0 % 36.6  39.4  35.3   Platelets 150 - 400 K/uL 161  146  155       Latest Ref Rng & Units 07/11/2023    9:59 AM 04/25/2023   10:29 AM 02/16/2023   10:44 AM  CMP  Glucose 70 - 99 mg/dL 409  811  914   BUN 8 - 23 mg/dL 18   24  17    Creatinine 0.61 - 1.24 mg/dL 7.82  9.56  2.13   Sodium 135 - 145 mmol/L 135  135  140   Potassium 3.5 - 5.1 mmol/L 4.1  4.7  4.0   Chloride 98 - 111 mmol/L 103  100  105   CO2 22 - 32 mmol/L 25  27  27    Calcium 8.9 - 10.3 mg/dL 9.2  9.2  8.8   Total Protein 6.5 - 8.1 g/dL 6.5  6.6  6.3   Total Bilirubin 0.0 - 1.2 mg/dL 0.6  0.8  0.5   Alkaline Phos 38 - 126 U/L 43  37  39   AST 15 -  41 U/L 16  16  17    ALT 0 - 44 U/L 14  15  16       No results found for: "CEA1", "CEA" / No results found for: "CEA1", "CEA" No results found for: "PSA1" No results found for: "WUJ811" No results found for: "CAN125"  No results found for: "TOTALPROTELP", "ALBUMINELP", "A1GS", "A2GS", "BETS", "BETA2SER", "GAMS", "MSPIKE", "SPEI" No results found for: "TIBC", "FERRITIN", "IRONPCTSAT" No results found for: "LDH"   STUDIES:   No results found.

## 2023-07-11 NOTE — Progress Notes (Signed)
 Patient is taking Zytiga as prescribed. He has not missed any doses and reports no side effects at this time.

## 2023-07-11 NOTE — Progress Notes (Signed)
Eligard injection given per orders. Patient tolerated it well without problems. Vitals stable and discharged home from clinic ambulatory. Follow up as scheduled.

## 2023-07-28 ENCOUNTER — Other Ambulatory Visit: Payer: Self-pay | Admitting: Hematology

## 2023-07-28 ENCOUNTER — Other Ambulatory Visit: Payer: Self-pay

## 2023-07-28 ENCOUNTER — Other Ambulatory Visit: Payer: Self-pay | Admitting: Pharmacy Technician

## 2023-07-28 DIAGNOSIS — C7951 Secondary malignant neoplasm of bone: Secondary | ICD-10-CM

## 2023-07-28 MED ORDER — ABIRATERONE ACETATE 250 MG PO TABS
1000.0000 mg | ORAL_TABLET | Freq: Every day | ORAL | 3 refills | Status: DC
  Filled 2023-07-28: qty 120, 30d supply, fill #0
  Filled 2023-09-07: qty 120, 30d supply, fill #1
  Filled 2023-09-28 (×3): qty 120, 30d supply, fill #2
  Filled 2023-10-20 – 2023-11-10 (×3): qty 120, 30d supply, fill #3

## 2023-07-28 NOTE — Progress Notes (Signed)
 Specialty Pharmacy Refill Coordination Note  Brendan Anderson is a 68 y.o. male contacted today regarding refills of specialty medication(s) Abiraterone  Acetate (ZYTIGA )   Patient requested Delivery   Delivery date: 08/09/23   Verified address: [address], [place], [place]  [phone]   Medication will be filled on 08/08/23.  This fill date is pending response to refill request from provider. Patient is aware and if they have not received fill by intended date they must follow up with pharmacy.

## 2023-07-31 ENCOUNTER — Other Ambulatory Visit: Payer: Self-pay

## 2023-08-07 ENCOUNTER — Other Ambulatory Visit: Payer: Self-pay | Admitting: Hematology

## 2023-08-08 ENCOUNTER — Encounter: Payer: Self-pay | Admitting: Hematology

## 2023-08-08 ENCOUNTER — Other Ambulatory Visit: Payer: Self-pay

## 2023-09-05 ENCOUNTER — Other Ambulatory Visit: Payer: Self-pay

## 2023-09-06 ENCOUNTER — Encounter (INDEPENDENT_AMBULATORY_CARE_PROVIDER_SITE_OTHER): Payer: Self-pay

## 2023-09-06 ENCOUNTER — Other Ambulatory Visit: Payer: Self-pay | Admitting: Hematology

## 2023-09-07 ENCOUNTER — Encounter: Payer: Self-pay | Admitting: Hematology

## 2023-09-07 ENCOUNTER — Other Ambulatory Visit: Payer: Self-pay

## 2023-09-07 NOTE — Progress Notes (Signed)
 Specialty Pharmacy Refill Coordination Note  Brendan Anderson is a 68 y.o. male contacted today regarding refills of specialty medication(s) Abiraterone  Acetate (ZYTIGA )   Patient requested Delivery   Delivery date: 09/08/23   Verified address: [address]   Brendan Anderson [id number]   Medication will be filled on 09/07/23.

## 2023-09-18 ENCOUNTER — Other Ambulatory Visit: Payer: Self-pay

## 2023-09-18 NOTE — Progress Notes (Signed)
 Specialty Pharmacy Ongoing Clinical Assessment Note  Brendan Anderson is a 68 y.o. male who is being followed by the specialty pharmacy service for RxSp Oncology   Patient's specialty medication(s) reviewed today: Abiraterone  Acetate (ZYTIGA )   Missed doses in the last 4 weeks: 0   Patient/Caregiver did not have any additional questions or concerns.   Therapeutic benefit summary: Patient is achieving benefit   Adverse events/side effects summary: Experienced adverse events/side effects (reports being weak/fatigued most days. tolerable at this time)   Patient's therapy is appropriate to: Continue    Goals Addressed             This Visit's Progress    Slow Disease Progression   On track    Patient is on track. Patient will maintain adherence. PSA remains improved at 0.17 ng/ml as of 07/11/23.          Follow up: 3 months  [facility]

## 2023-09-26 ENCOUNTER — Other Ambulatory Visit (HOSPITAL_COMMUNITY): Payer: Self-pay

## 2023-09-28 ENCOUNTER — Encounter (INDEPENDENT_AMBULATORY_CARE_PROVIDER_SITE_OTHER): Payer: Self-pay

## 2023-09-28 ENCOUNTER — Other Ambulatory Visit: Payer: Self-pay | Admitting: Pharmacy Technician

## 2023-09-28 ENCOUNTER — Other Ambulatory Visit: Payer: Self-pay

## 2023-09-28 NOTE — Progress Notes (Signed)
 Specialty Pharmacy Refill Coordination Note  Brendan Anderson is a 68 y.o. male contacted today regarding refills of specialty medication(s) Abiraterone  Acetate (ZYTIGA )   Patient requested (Patient-Rptd) Delivery   Delivery date: 10/02/23   Verified address: [address] [place] [address]   Medication will be filled on 09/29/23.

## 2023-09-29 ENCOUNTER — Other Ambulatory Visit: Payer: Self-pay

## 2023-10-03 ENCOUNTER — Inpatient Hospital Stay

## 2023-10-03 ENCOUNTER — Inpatient Hospital Stay: Attending: Hematology | Admitting: Hematology

## 2023-10-03 VITALS — BP 125/79 | HR 86 | Temp 98.7°F | Resp 18 | Wt 185.2 lb

## 2023-10-03 DIAGNOSIS — C775 Secondary and unspecified malignant neoplasm of intrapelvic lymph nodes: Secondary | ICD-10-CM | POA: Diagnosis not present

## 2023-10-03 DIAGNOSIS — Z7952 Long term (current) use of systemic steroids: Secondary | ICD-10-CM | POA: Insufficient documentation

## 2023-10-03 DIAGNOSIS — M810 Age-related osteoporosis without current pathological fracture: Secondary | ICD-10-CM | POA: Insufficient documentation

## 2023-10-03 DIAGNOSIS — Z9079 Acquired absence of other genital organ(s): Secondary | ICD-10-CM | POA: Insufficient documentation

## 2023-10-03 DIAGNOSIS — Z87891 Personal history of nicotine dependence: Secondary | ICD-10-CM | POA: Diagnosis not present

## 2023-10-03 DIAGNOSIS — Z923 Personal history of irradiation: Secondary | ICD-10-CM | POA: Insufficient documentation

## 2023-10-03 DIAGNOSIS — Z806 Family history of leukemia: Secondary | ICD-10-CM | POA: Insufficient documentation

## 2023-10-03 DIAGNOSIS — R5383 Other fatigue: Secondary | ICD-10-CM | POA: Diagnosis not present

## 2023-10-03 DIAGNOSIS — C61 Malignant neoplasm of prostate: Secondary | ICD-10-CM | POA: Diagnosis not present

## 2023-10-03 DIAGNOSIS — Z808 Family history of malignant neoplasm of other organs or systems: Secondary | ICD-10-CM | POA: Diagnosis not present

## 2023-10-03 DIAGNOSIS — C7951 Secondary malignant neoplasm of bone: Secondary | ICD-10-CM | POA: Insufficient documentation

## 2023-10-03 DIAGNOSIS — Z803 Family history of malignant neoplasm of breast: Secondary | ICD-10-CM | POA: Diagnosis not present

## 2023-10-03 DIAGNOSIS — Z8042 Family history of malignant neoplasm of prostate: Secondary | ICD-10-CM | POA: Diagnosis not present

## 2023-10-03 LAB — CBC WITH DIFFERENTIAL/PLATELET
Abs Immature Granulocytes: 0.01 K/uL (ref 0.00–0.07)
Basophils Absolute: 0.1 K/uL (ref 0.0–0.1)
Basophils Relative: 1 %
Eosinophils Absolute: 0.3 K/uL (ref 0.0–0.5)
Eosinophils Relative: 7 %
HCT: 36.8 % — ABNORMAL LOW (ref 39.0–52.0)
Hemoglobin: 12.7 g/dL — ABNORMAL LOW (ref 13.0–17.0)
Immature Granulocytes: 0 %
Lymphocytes Relative: 18 %
Lymphs Abs: 0.7 K/uL (ref 0.7–4.0)
MCH: 32.6 pg (ref 26.0–34.0)
MCHC: 34.5 g/dL (ref 30.0–36.0)
MCV: 94.4 fL (ref 80.0–100.0)
Monocytes Absolute: 0.4 K/uL (ref 0.1–1.0)
Monocytes Relative: 11 %
Neutro Abs: 2.5 K/uL (ref 1.7–7.7)
Neutrophils Relative %: 63 %
Platelets: 161 K/uL (ref 150–400)
RBC: 3.9 MIL/uL — ABNORMAL LOW (ref 4.22–5.81)
RDW: 12.6 % (ref 11.5–15.5)
WBC: 4 K/uL (ref 4.0–10.5)
nRBC: 0 % (ref 0.0–0.2)

## 2023-10-03 LAB — COMPREHENSIVE METABOLIC PANEL WITH GFR
ALT: 15 U/L (ref 0–44)
AST: 18 U/L (ref 15–41)
Albumin: 3.7 g/dL (ref 3.5–5.0)
Alkaline Phosphatase: 46 U/L (ref 38–126)
Anion gap: 10 (ref 5–15)
BUN: 21 mg/dL (ref 8–23)
CO2: 25 mmol/L (ref 22–32)
Calcium: 9 mg/dL (ref 8.9–10.3)
Chloride: 105 mmol/L (ref 98–111)
Creatinine, Ser: 1.2 mg/dL (ref 0.61–1.24)
GFR, Estimated: >60 mL/min (ref >60)
Glucose, Bld: 92 mg/dL (ref 70–99)
Potassium: 4.1 mmol/L (ref 3.5–5.1)
Sodium: 140 mmol/L (ref 135–145)
Total Bilirubin: 0.7 mg/dL (ref 0.0–1.2)
Total Protein: 6.5 g/dL (ref 6.5–8.1)

## 2023-10-03 LAB — PSA: Prostatic Specific Antigen: 0.09 ng/mL (ref 0.00–4.00)

## 2023-10-03 MED ORDER — SERTRALINE HCL 25 MG PO TABS: 25.0000 mg | ORAL_TABLET | Freq: Every day | ORAL | 3 refills | Status: DC

## 2023-10-03 MED ORDER — METHYLPHENIDATE HCL 5 MG PO TABS: 5.0000 mg | ORAL_TABLET | Freq: Two times a day (BID) | ORAL | 0 refills | Status: DC

## 2023-10-03 NOTE — Progress Notes (Signed)
 [facility] [address], [place] [address]    Clinic Day:  10/03/2023  Referring physician: Graydon Mt, MD  Patient Care Team: Graydon Mt, MD as PCP - General (Internal Medicine) Grayce Buddle, RN as Registered Nurse (Medical Oncology) Rogers Hai, MD as Medical Oncologist (Medical Oncology) Celestia Joesph SQUIBB, RN as Oncology Nurse Navigator (Medical Oncology)   ASSESSMENT & PLAN:   Assessment:   1.  Stage IV (pT3b pN1 M1) prostate cancer: - Prostate biopsy (04/24/2019): Prostatic adenocarcinoma, Gleason's 4+5=9, done in [place] by Dr. Jewel.  PSA was 6.3. - Radical prostatectomy and lymph node resection (06/27/2019) - Pathology: Prostatic adenocarcinoma, Gleason 5+4=9 (grade group 5).  Extraprostatic extension is present at the right posterior mid, bladder neck and bilateral posterior base, bilateral seminal vesicles invasion present (pT3b).  LVI present.  Margins negative.  Metastatic adenocarcinoma in 1/4 lymph nodes in the right pelvis.  0/5 lymph nodes in the left pelvis.  PT3BPN1. - Postsurgery PSA of 0.2.  10/29/2019 PSA 0.38. - XRT to prostate fossa, pelvic lymph nodes from 12/02/2019 - 01/23/2020 with short-term ADT - PSA initially became undetectable, but quickly began increasing and was noted to have biochemical recurrence in November 2022, PSA 0.46.  PSA increased to 2.04 in February 2023. - PSMA PET scan (07/05/2021): 2 very small presumed foci of nodal tissue in the left middle mediastinum along the descending thoracic aorta concerning for nodal prostate cancer metastasis.  No evidence of local recurrence in the prostatectomy bed or nodal metastasis in the pelvis.  No evidence of visceral or skeletal metastasis. - Germline mutation testing ([facility]) negative on 12/11/2019 - SBRT to the PET positive intrathoracic lymph nodes, 50 Gray in 5 fractions from 08/16/2021 through 08/27/2021. - PSA: 2.26 (10/22/2021), 2.04 (06/08/2021), 0.46 (02/09/2021), 0.17  (11/05/2020), <0.015 (04/22/2020), 0.38 (10/29/2019), 0.20 (09/19/2019). - Total testosterone  427 (11/04/2020) - PSMA PET scan (10/16/2022): New small focus of activity within the central and 1 vertebral body and within the left transverse process of the T11 vertebral body.  No evidence of cancer in the prostate bed or adenopathy. - Status post XRT 50 Gray in 5 fractions completed on 11/22/2022 - PSA increased to 12.8 on 12/15/2022.  He is a candidate for triplet therapy based on Gleason 9 (definition of high risk disease as used in LATITUDE trial).  However because of oligometastatic disease, we decided on ADT and abiraterone . - ADT with abiraterone  started on 12/15/2022   2.  Social/family history: - He is married and lives with his wife.  He worked with heating and air conditioning.  He had very little exposure to asbestos.  No other chemical exposure. - He quit smoking in 1985. - Mother had breast cancer in her early 30s and died with acute leukemia few months after receiving chemotherapy for breast cancer.  Plan:  1.  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  Last Eligard  45 mg on 07/11/2023. - He reports fatigue since the start of ADT.  On some days he is unable to do his day-to-day activities. - Reviewed labs today: Normal LFTs and creatinine.  Calcium normal.  CBC grossly normal with hemoglobin 12.7.  Last PSA 0.17 on 07/11/2023, down from 0.84 on 04/25/2023.  PSA from today is pending. - Continue Zytiga  1000 mg daily and prednisone  5 mg daily.  Continue Eligard  every 6 months.  If the PSA remains very low, and he continues to have severe fatigue, may try decreasing  dose of Zytiga  to 500 or 750 mg daily. - RTC 12 weeks for follow-up with repeat PSA and labs.    2.  Osteoporosis: - Continue Prolia injections every 6 months with Dr. Elaine and done well.  Continue calcium and vitamin D supplements.  3.  ADT associated fatigue: - I have encouraged him to do  weightbearing exercises.  His wife thinks that part of it is from depression.  We will start him on Zoloft  25 mg daily.  I have warned him about serotonin syndrome as he is taking trazodone  for sleep for many years. - I have given prescription for Ritalin  5 mg tablets to be taken as needed when he feels severely fatigued.  I have recommended that he try taking half a tablet (2.5 mg) in the mornings and may repeat the dose around noon time if needed.  If half tablet does not work, he may take 5 mg tablet in the morning as needed.  Orders Placed This Encounter  Procedures   CBC with Differential    Standing Status:   Future    Expected Date:   01/01/2024    Expiration Date:   03/31/2024   Comprehensive metabolic panel    Standing Status:   Future    Expected Date:   01/01/2024    Expiration Date:   03/31/2024   Magnesium     Standing Status:   Future    Expected Date:   01/01/2024    Expiration Date:   03/31/2024   PSA    Standing Status:   Future    Expected Date:   01/01/2024    Expiration Date:   03/31/2024      LILLETTE Hummingbird R Teague,acting as a scribe for Alean Stands, MD.,have documented all relevant documentation on the behalf of Alean Stands, MD,as directed by  Alean Stands, MD while in the presence of Alean Stands, MD.  I, Alean Stands MD, have reviewed the above documentation for accuracy and completeness, and I agree with the above.     Alean Stands, MD   7/22/20254:19 PM  CHIEF COMPLAINT:   Diagnosis: Prostate cancer metastatic to intrapelvic lymph node    Cancer Staging  No matching staging information was found for the patient.    Prior Therapy:  Radical prostatectomy and BPL ND on 06/27/2019  XRT to prostate fossa, pelvic lymph nodes from 12/02/2019 - 01/23/2020 with short-term ADT  SBRT to the mediastinal lymph nodes, 50 Gray in 5 fractions from 08/16/2021 through 08/27/2021  Current Therapy: ADT and  abiraterone /prednisone    HISTORY OF PRESENT ILLNESS:   Oncology History  Prostate cancer metastatic to intrapelvic lymph node (HCC)  08/07/2018 Initial Diagnosis   Prostate cancer metastatic to intrapelvic lymph node (HCC)   12/19/2019 Genetic Testing   Negative genetic testing on the multicancer panel.  The Multi-Gene Panel offered by [facility] includes sequencing and/or deletion duplication testing of the following 85 genes: AIP, ALK, APC, ATM, AXIN2,BAP1,  BARD1, BLM, BMPR1A, BRCA1, BRCA2, BRIP1, CASR, CDC73, CDH1, CDK4, CDKN1B, CDKN1C, CDKN2A (p14ARF), CDKN2A (p16INK4a), CEBPA, CHEK2, CTNNA1, DICER1, DIS3L2, EGFR (c.2369C>T, p.Thr790Met variant only), EPCAM (Deletion/duplication testing only), FH, FLCN, GATA2, GPC3, GREM1 (Promoter region deletion/duplication testing only), HOXB13 (c.251G>A, p.Gly84Glu), HRAS, KIT, MAX, MEN1, MET, MITF (c.952G>A, p.Glu318Lys variant only), MLH1, MSH2, MSH3, MSH6, MUTYH, NBN, NF1, NF2, NTHL1, PALB2, PDGFRA, PHOX2B, PMS2, POLD1, POLE, POT1, PRKAR1A, PTCH1, PTEN, RAD50, RAD51C, RAD51D, RB1, RECQL4, RET, RNF43, RUNX1, SDHAF2, SDHA (sequence changes only), SDHB, SDHC, SDHD, SMAD4, SMARCA4, SMARCB1, SMARCE1, STK11, SUFU, TERC,  TERT, TMEM127, TP53, TSC1, TSC2, VHL, WRN and WT1.  The report date is December 19, 2019.      INTERVAL HISTORY:   Waddell is a 68 y.o. male presenting to clinic today for follow up of Castration sensitive metastatic prostate cancer. He was last seen by me on 07/11/2023.  Today, he states that he is doing well overall. His appetite level is at 100%. His energy level is at 25%. He is accompanied by his wife. Duquan reports fatigue and generalized weakness that began since starting Zytiga  and Eligard  injections in October 2024. He is taking Prednisone  and Zytiga  as prescribed. Prednisone  has not improved his energy. His wife believes depression is contributing to fatigue. Leroi is occasionally drinking Boost to improve energy. He is unable to do  repairs around his house. He denies any nausea.   His wife notes Lazer is not stable on his feet and has difficulty sleeping. He is taking trazodone  200 mg nightly, which improves sleep quality though it typically takes him 2 hours to fall asleep after taking the medication. His wife states when Ackworth sleeps well, his energy is improved the next day. He is occasionally drinking Boost to improve energy.   PAST MEDICAL HISTORY:   Past Medical History: Past Medical History:  Diagnosis Date   Anxiety    Arthritis    hip knees wrist   Depression    Family history of breast cancer    Family history of prostate cancer    GERD (gastroesophageal reflux disease)    Osteoporosis    Prostate cancer [facility])    Skin cancer     Surgical History: Past Surgical History:  Procedure Laterality Date   BICEPS TENDON REPAIR Left 07/2016   EYE SURGERY Bilateral 2005   LYMPHADENECTOMY Bilateral 06/27/2019   Procedure: LYMPHADENECTOMY, PELVIC;  Surgeon: Renda Glance, MD;  Location: [facility];  Service: Urology;  Laterality: Bilateral;   MOLE REMOVAL  2008   pre CA from face   NASAL SINUS SURGERY  2010   ROBOT ASSISTED LAPAROSCOPIC RADICAL PROSTATECTOMY N/A 06/27/2019   Procedure: XI ROBOTIC ASSISTED LAPAROSCOPIC RADICAL PROSTATECTOMY LEVEL 2;  Surgeon: Renda Glance, MD;  Location: [facility];  Service: Urology;  Laterality: N/A;   SHOULDER ARTHROSCOPY WITH ROTATOR CUFF REPAIR Left 11/2016    Social History: Social History   Socioeconomic History   Marital status: Married    Spouse name: Consuelo   Number of children: 2   Years of education: Not on file   Highest education level: Not on file  Occupational History   Occupation: retired    Comment: Garment/textile technologist  Tobacco Use   Smoking status: Former    Current packs/day: 0.00    Average packs/day: 0.3 packs/day for 10.0 years (2.5 ttl pk-yrs)    Types: Cigars, Cigarettes    Start date: 06/18/1973    Quit date: 06/19/1983    Years  since quitting: 40.3   Smokeless tobacco: Never  Vaping Use   Vaping status: Never Used  Substance and Sexual Activity   Alcohol use: Yes    Comment: rare   Drug use: Never   Sexual activity: Not Currently  Other Topics Concern   Not on file  Social History Narrative   Not on file   Social Drivers of Health   Financial Resource Strain: Not on file  Food Insecurity: No Food Insecurity (11/03/2022)   Hunger Vital Sign    Worried About Running Out of Food in the Last Year: Never true  Ran Out of Food in the Last Year: Never true  Transportation Needs: No Transportation Needs (11/03/2022)   PRAPARE - Administrator, Civil Service (Medical): No    Lack of Transportation (Non-Medical): No  Physical Activity: Not on file  Stress: Not on file  Social Connections: Not on file  Intimate Partner Violence: Not At Risk (11/03/2022)   Humiliation, Afraid, Rape, and Kick questionnaire    Fear of Current or Ex-Partner: No    Emotionally Abused: No    Physically Abused: No    Sexually Abused: No    Family History: Family History  Problem Relation Age of Onset   Leukemia Mother    Breast cancer Mother 33   Cancer Father        NOS   Breast cancer Paternal Aunt        dx in her 57s   Brain cancer Paternal Uncle        d. 51s   Heart attack Maternal Grandmother    Stroke Maternal Grandfather    Asthma Paternal Grandmother    Prostate cancer Paternal Grandfather    Cancer Nephew 10       brother's grandson - NOS   Colon cancer Neg Hx    Pancreatic cancer Neg Hx     Current Medications:  Current Outpatient Medications:    abiraterone  acetate (ZYTIGA ) 250 MG tablet, Take 4 tablets (1,000 mg total) by mouth daily. Take on an empty stomach 1 hour before or 2 hours after a meal, Disp: 120 tablet, Rfl: 3   Al Hyd-Mg Tr-Alg Ac-Sod Bicarb (GAVISCON-2 PO), Take 2 tablets by mouth at bedtime., Disp: , Rfl:    ascorbic acid (VITAMIN C) 250 MG tablet, Take 500 mg by mouth daily  at 12 noon., Disp: , Rfl:    Calcium Carbonate Antacid 400 MG CHEW, Chew 800 mg by mouth 2 (two) times daily., Disp: , Rfl:    celecoxib  (CELEBREX ) 200 MG capsule, Take 200 mg by mouth daily., Disp: , Rfl:    cetirizine (ZYRTEC) 10 MG tablet, Take 10 mg by mouth daily as needed for allergies., Disp: , Rfl:    cyclobenzaprine (FLEXERIL) 5 MG tablet, Take 5 mg by mouth at bedtime as needed., Disp: , Rfl:    denosumab (PROLIA) 60 MG/ML SOSY injection, Inject 60 mg into the skin every 6 (six) months., Disp: , Rfl:    esomeprazole (NEXIUM) 40 MG capsule, Take 40 mg by mouth daily., Disp: , Rfl:    meclizine  (ANTIVERT ) 12.5 MG tablet, Take 1 tablet (12.5 mg total) by mouth 3 (three) times daily as needed for dizziness., Disp: 15 tablet, Rfl: 0   METAMUCIL FIBER PO, Take by mouth., Disp: , Rfl:    methylphenidate  (RITALIN ) 5 MG tablet, Take 1 tablet (5 mg total) by mouth 2 (two) times daily., Disp: 30 tablet, Rfl: 0   Multiple Vitamin (MULTIVITAMIN) tablet, Take 1 tablet by mouth daily., Disp: , Rfl:    Multiple Vitamins-Minerals (MULTIVITAMIN ADULT, MINERALS,) TABS, Take 1 tablet by mouth daily., Disp: , Rfl:    pantoprazole  (PROTONIX ) 40 MG tablet, Take 40 mg by mouth daily., Disp: , Rfl:    predniSONE  (DELTASONE ) 5 MG tablet, Take 1 tablet by mouth once daily with breakfast, Disp: 30 tablet, Rfl: 5   predniSONE  (STERAPRED UNI-PAK 21 TAB) 10 MG (21) TBPK tablet, SMARTSIG:- Tablet(s) By Mouth -, Disp: , Rfl:    sertraline  (ZOLOFT ) 25 MG tablet, Take 1 tablet (25 mg total) by mouth  daily., Disp: 90 tablet, Rfl: 3   tizanidine (ZANAFLEX) 2 MG capsule, Take 2 mg by mouth daily as needed for muscle spasms., Disp: , Rfl:    traZODone  (DESYREL ) 100 MG tablet, Take 100 mg by mouth at bedtime., Disp: , Rfl:    Vitamin D, Ergocalciferol, (DRISDOL) 1.25 MG (50000 UNIT) CAPS capsule, Take 50,000 Units by mouth once a week. Wednesday, Disp: , Rfl:    Allergies: Allergies  Allergen Reactions   Codeine Other (See  Comments)    MAKES HIM HYPER    REVIEW OF SYSTEMS:   Review of Systems  Constitutional:  Positive for fatigue. Negative for chills and fever.  HENT:   Negative for lump/mass, mouth sores, nosebleeds, sore throat and trouble swallowing.   Eyes:  Negative for eye problems.  Respiratory:  Positive for shortness of breath (with exertion). Negative for cough.   Cardiovascular:  Negative for chest pain, leg swelling and palpitations.  Gastrointestinal:  Negative for abdominal pain, constipation, diarrhea, nausea and vomiting.  Genitourinary:  Negative for bladder incontinence, difficulty urinating, dysuria, frequency, hematuria and nocturia.   Musculoskeletal:  Negative for arthralgias, back pain, flank pain, myalgias and neck pain.  Skin:  Negative for itching and rash.  Neurological:  Negative for dizziness, headaches and numbness.  Hematological:  Does not bruise/bleed easily.  Psychiatric/Behavioral:  Positive for depression and sleep disturbance. Negative for suicidal ideas. The patient is nervous/anxious.   All other systems reviewed and are negative.    VITALS:   Blood pressure 125/79, pulse 86, temperature 98.7 F (37.1 C), temperature source Tympanic, resp. rate 18, weight 185 lb 3 oz (84 kg), SpO2 98%.  Wt Readings from Last 3 Encounters:  10/03/23 185 lb 3 oz (84 kg)  07/11/23 183 lb 13.8 oz (83.4 kg)  04/25/23 181 lb 3.5 oz (82.2 kg)    Body mass index is 28.16 kg/m.  Performance status (ECOG): 1 - Symptomatic but completely ambulatory  PHYSICAL EXAM:   Physical Exam Vitals and nursing note reviewed. Exam conducted with a chaperone present.  Constitutional:      Appearance: Normal appearance.  Cardiovascular:     Rate and Rhythm: Normal rate and regular rhythm.     Pulses: Normal pulses.     Heart sounds: Normal heart sounds.  Pulmonary:     Effort: Pulmonary effort is normal.     Breath sounds: Normal breath sounds.  Abdominal:     Palpations: Abdomen is  soft. There is no hepatomegaly, splenomegaly or mass.     Tenderness: There is no abdominal tenderness.  Musculoskeletal:     Right lower leg: No edema.     Left lower leg: No edema.  Lymphadenopathy:     Cervical: No cervical adenopathy.     Right cervical: No superficial, deep or posterior cervical adenopathy.    Left cervical: No superficial, deep or posterior cervical adenopathy.     Upper Body:     Right upper body: No supraclavicular or axillary adenopathy.     Left upper body: No supraclavicular or axillary adenopathy.  Neurological:     General: No focal deficit present.     Mental Status: He is alert and oriented to person, place, and time.  Psychiatric:        Mood and Affect: Mood normal.        Behavior: Behavior normal.     LABS:      Latest Ref Rng & Units 10/03/2023    1:37 PM 07/11/2023  9:59 AM 04/25/2023   10:29 AM  CBC  WBC 4.0 - 10.5 K/uL 4.0  3.1  4.2   Hemoglobin 13.0 - 17.0 g/dL 87.2  87.3  86.5   Hematocrit 39.0 - 52.0 % 36.8  36.6  39.4   Platelets 150 - 400 K/uL 161  161  146       Latest Ref Rng & Units 10/03/2023    1:37 PM 07/11/2023    9:59 AM 04/25/2023   10:29 AM  CMP  Glucose 70 - 99 mg/dL 92  890  893   BUN 8 - 23 mg/dL 21  18  24    Creatinine 0.61 - 1.24 mg/dL 8.79  8.79  8.84   Sodium 135 - 145 mmol/L 140  135  135   Potassium 3.5 - 5.1 mmol/L 4.1  4.1  4.7   Chloride 98 - 111 mmol/L 105  103  100   CO2 22 - 32 mmol/L 25  25  27    Calcium 8.9 - 10.3 mg/dL 9.0  9.2  9.2   Total Protein 6.5 - 8.1 g/dL 6.5  6.5  6.6   Total Bilirubin 0.0 - 1.2 mg/dL 0.7  0.6  0.8   Alkaline Phos 38 - 126 U/L 46  43  37   AST 15 - 41 U/L 18  16  16    ALT 0 - 44 U/L 15  14  15       No results found for: CEA1, CEA / No results found for: CEA1, CEA No results found for: PSA1 No results found for: CAN199 No results found for: CAN125  No results found for: TOTALPROTELP, ALBUMINELP, A1GS, A2GS, BETS, BETA2SER, GAMS,  MSPIKE, SPEI No results found for: TIBC, FERRITIN, IRONPCTSAT No results found for: LDH   STUDIES:   No results found.

## 2023-10-03 NOTE — Patient Instructions (Addendum)
 [facility] at [facility] Discharge Instructions   You were seen and examined today by Dr. Rogers.  He reviewed the results of your lab work which are normal/stable.   Continue Zytiga  and prednisone  as prescribed.   The treatments will decrease your muscle mass. Dr. MARLA recommends you take in 60 g of protein a day as well as doing weight bearing exercises to help prevent this.   We will see you back in 3 months.    Return as scheduled.    Thank you for choosing [facility] at [facility] to provide your oncology and hematology care.  To afford each patient quality time with our provider, please arrive at least 15 minutes before your scheduled appointment time.   If you have a lab appointment with the Cancer Center please come in thru the Main Entrance and check in at the main information desk.  You need to re-schedule your appointment should you arrive 10 or more minutes late.  We strive to give you quality time with our providers, and arriving late affects you and other patients whose appointments are after yours.  Also, if you no show three or more times for appointments you may be dismissed from the clinic at the providers discretion.     Again, thank you for choosing [facility].  Our hope is that these requests will decrease the amount of time that you wait before being seen by our physicians.       _____________________________________________________________  Should you have questions after your visit to [facility], please contact our office at [phone] and follow the prompts.  Our office hours are 8:00 a.m. and 4:30 p.m. Monday - Friday.  Please note that voicemails left after 4:00 p.m. may not be returned until the following business day.  We are closed weekends and major holidays.  You do have access to a nurse 24-7, just call the main number to the clinic [phone] and do not press any options, hold  on the line and a nurse will answer the phone.    For prescription refill requests, have your pharmacy contact our office and allow 72 hours.    Due to Covid, you will need to wear a mask upon entering the hospital. If you do not have a mask, a mask will be given to you at the Main Entrance upon arrival. For doctor visits, patients may have 1 support person age 18 or older with them. For treatment visits, patients can not have anyone with them due to social distancing guidelines and our immunocompromised population.

## 2023-10-03 NOTE — Progress Notes (Signed)
 Patient is taking Zytiga as prescribed. He has not missed any doses and reports no side effects at this time.

## 2023-10-04 LAB — TESTOSTERONE: Testosterone: <3 ng/dL — ABNORMAL LOW (ref 264–916)

## 2023-10-05 ENCOUNTER — Other Ambulatory Visit: Payer: Self-pay | Admitting: *Deleted

## 2023-10-05 MED ORDER — SERTRALINE HCL 25 MG PO TABS: 25.0000 mg | ORAL_TABLET | Freq: Every day | ORAL | 3 refills | Status: AC

## 2023-10-20 ENCOUNTER — Other Ambulatory Visit: Payer: Self-pay

## 2023-10-23 ENCOUNTER — Other Ambulatory Visit: Payer: Self-pay

## 2023-11-02 ENCOUNTER — Other Ambulatory Visit: Payer: Self-pay | Admitting: *Deleted

## 2023-11-02 MED ORDER — METHYLPHENIDATE HCL 5 MG PO TABS: 5.0000 mg | ORAL_TABLET | Freq: Two times a day (BID) | ORAL | 0 refills | Status: DC

## 2023-11-10 ENCOUNTER — Other Ambulatory Visit: Payer: Self-pay | Admitting: Pharmacy Technician

## 2023-11-10 ENCOUNTER — Other Ambulatory Visit: Payer: Self-pay

## 2023-11-10 NOTE — Progress Notes (Signed)
 Specialty Pharmacy Refill Coordination Note  Brendan Anderson is a 68 y.o. male contacted today regarding refills of specialty medication(s) Abiraterone  Acetate (ZYTIGA )   Patient requested Delivery UPS  Delivery date: 11/16/23   Verified address: [address]   [place] [address]   Medication will be filled on 11/15/23.

## 2023-12-04 ENCOUNTER — Other Ambulatory Visit: Payer: Self-pay | Admitting: Oncology

## 2023-12-04 ENCOUNTER — Other Ambulatory Visit: Payer: Self-pay

## 2023-12-04 DIAGNOSIS — C61 Malignant neoplasm of prostate: Secondary | ICD-10-CM

## 2023-12-04 NOTE — Progress Notes (Signed)
 Specialty Pharmacy Ongoing Clinical Assessment Note  Brendan Anderson is a 68 y.o. male who is being followed by the specialty pharmacy service for RxSp Oncology   Patient's specialty medication(s) reviewed today: Abiraterone  Acetate (ZYTIGA )   Missed doses in the last 4 weeks: 0   Patient/Caregiver did not have any additional questions or concerns.   Therapeutic benefit summary: Patient is achieving benefit   Adverse events/side effects summary: Experienced adverse events/side effects (continued fatigue)   Patient's therapy is appropriate to: Continue    Goals Addressed             This Visit's Progress    Slow Disease Progression       Patient is on track. Patient will maintain adherence. PSA remains improved at 0.09 ng/ml as of 10/03/23.          Follow up: 6 months  [name] M Harman Langhans Specialty Pharmacist

## 2023-12-04 NOTE — Progress Notes (Signed)
 Specialty Pharmacy Refill Coordination Note  Brendan Anderson is a 68 y.o. male contacted today regarding refills of specialty medication(s) Abiraterone  Acetate (ZYTIGA )   Patient requested Delivery   Delivery date: 12/11/23   Verified address: [address]   [place] [address]   Medication will be filled on 12/08/23.

## 2023-12-05 ENCOUNTER — Other Ambulatory Visit (HOSPITAL_COMMUNITY): Payer: Self-pay

## 2023-12-05 ENCOUNTER — Other Ambulatory Visit: Payer: Self-pay

## 2023-12-05 ENCOUNTER — Other Ambulatory Visit: Payer: Self-pay | Admitting: *Deleted

## 2023-12-05 MED ORDER — ABIRATERONE ACETATE 250 MG PO TABS
1000.0000 mg | ORAL_TABLET | Freq: Every day | ORAL | 3 refills | Status: DC
  Filled 2023-12-05: qty 120, 30d supply, fill #0
  Filled 2024-01-01: qty 120, 30d supply, fill #1
  Filled 2024-01-25: qty 120, 30d supply, fill #2
  Filled 2024-02-23 (×2): qty 120, 30d supply, fill #3

## 2023-12-05 MED ORDER — METHYLPHENIDATE HCL 5 MG PO TABS: 5.0000 mg | ORAL_TABLET | Freq: Two times a day (BID) | ORAL | 0 refills | Status: DC

## 2023-12-05 MED ORDER — METHYLPHENIDATE HCL 5 MG PO TABS
5.0000 mg | ORAL_TABLET | Freq: Two times a day (BID) | ORAL | 0 refills | Status: DC
  Filled 2023-12-05: qty 30, 15d supply, fill #0

## 2023-12-06 ENCOUNTER — Other Ambulatory Visit (HOSPITAL_COMMUNITY): Payer: Self-pay

## 2023-12-08 ENCOUNTER — Other Ambulatory Visit: Payer: Self-pay

## 2023-12-26 ENCOUNTER — Ambulatory Visit: Admitting: Oncology

## 2023-12-26 ENCOUNTER — Other Ambulatory Visit

## 2023-12-29 ENCOUNTER — Other Ambulatory Visit: Payer: Self-pay

## 2023-12-29 ENCOUNTER — Encounter (INDEPENDENT_AMBULATORY_CARE_PROVIDER_SITE_OTHER): Payer: Self-pay

## 2024-01-01 ENCOUNTER — Other Ambulatory Visit: Payer: Self-pay

## 2024-01-01 NOTE — Progress Notes (Signed)
 Specialty Pharmacy Refill Coordination Note  Brendan Anderson is a 68 y.o. male contacted today regarding refills of specialty medication(s) Abiraterone  Acetate (ZYTIGA )   Patient requested Delivery   Delivery date: 01/03/24   Verified address: [address] [place] [address]   Medication will be filled on 10.21.25.

## 2024-01-08 ENCOUNTER — Other Ambulatory Visit: Payer: Self-pay | Admitting: *Deleted

## 2024-01-08 MED ORDER — METHYLPHENIDATE HCL 5 MG PO TABS: 5.0000 mg | ORAL_TABLET | Freq: Two times a day (BID) | ORAL | 0 refills | Status: DC

## 2024-01-10 ENCOUNTER — Inpatient Hospital Stay

## 2024-01-10 ENCOUNTER — Inpatient Hospital Stay: Admitting: Oncology

## 2024-01-10 ENCOUNTER — Ambulatory Visit

## 2024-01-10 ENCOUNTER — Inpatient Hospital Stay: Attending: Hematology

## 2024-01-10 VITALS — BP 113/73 | HR 85 | Temp 98.2°F | Resp 18 | Ht 68.0 in | Wt 189.0 lb

## 2024-01-10 DIAGNOSIS — Z808 Family history of malignant neoplasm of other organs or systems: Secondary | ICD-10-CM | POA: Diagnosis not present

## 2024-01-10 DIAGNOSIS — Z7952 Long term (current) use of systemic steroids: Secondary | ICD-10-CM | POA: Diagnosis not present

## 2024-01-10 DIAGNOSIS — M810 Age-related osteoporosis without current pathological fracture: Secondary | ICD-10-CM | POA: Diagnosis not present

## 2024-01-10 DIAGNOSIS — F39 Unspecified mood [affective] disorder: Secondary | ICD-10-CM | POA: Insufficient documentation

## 2024-01-10 DIAGNOSIS — Z923 Personal history of irradiation: Secondary | ICD-10-CM | POA: Diagnosis not present

## 2024-01-10 DIAGNOSIS — Z806 Family history of leukemia: Secondary | ICD-10-CM | POA: Insufficient documentation

## 2024-01-10 DIAGNOSIS — R5383 Other fatigue: Secondary | ICD-10-CM | POA: Diagnosis not present

## 2024-01-10 DIAGNOSIS — Z803 Family history of malignant neoplasm of breast: Secondary | ICD-10-CM | POA: Insufficient documentation

## 2024-01-10 DIAGNOSIS — C61 Malignant neoplasm of prostate: Secondary | ICD-10-CM

## 2024-01-10 DIAGNOSIS — Z8042 Family history of malignant neoplasm of prostate: Secondary | ICD-10-CM | POA: Diagnosis not present

## 2024-01-10 DIAGNOSIS — Z87891 Personal history of nicotine dependence: Secondary | ICD-10-CM | POA: Insufficient documentation

## 2024-01-10 DIAGNOSIS — Z79899 Other long term (current) drug therapy: Secondary | ICD-10-CM | POA: Diagnosis not present

## 2024-01-10 DIAGNOSIS — C7951 Secondary malignant neoplasm of bone: Secondary | ICD-10-CM

## 2024-01-10 DIAGNOSIS — Z9079 Acquired absence of other genital organ(s): Secondary | ICD-10-CM | POA: Insufficient documentation

## 2024-01-10 LAB — PSA: Prostatic Specific Antigen: 0.11 ng/mL (ref 0.00–4.00)

## 2024-01-10 LAB — COMPREHENSIVE METABOLIC PANEL WITH GFR
ALT: 11 U/L (ref 0–44)
AST: 17 U/L (ref 15–41)
Albumin: 4.3 g/dL (ref 3.5–5.0)
Alkaline Phosphatase: 50 U/L (ref 38–126)
Anion gap: 10 (ref 5–15)
BUN: 18 mg/dL (ref 8–23)
CO2: 27 mmol/L (ref 22–32)
Calcium: 9.2 mg/dL (ref 8.9–10.3)
Chloride: 105 mmol/L (ref 98–111)
Creatinine, Ser: 1.13 mg/dL (ref 0.61–1.24)
GFR, Estimated: >60 mL/min (ref >60)
Glucose, Bld: 116 mg/dL — ABNORMAL HIGH (ref 70–99)
Potassium: 3.8 mmol/L (ref 3.5–5.1)
Sodium: 141 mmol/L (ref 135–145)
Total Bilirubin: 0.4 mg/dL (ref 0.0–1.2)
Total Protein: 6.8 g/dL (ref 6.5–8.1)

## 2024-01-10 LAB — CBC WITH DIFFERENTIAL/PLATELET
Abs Immature Granulocytes: 0.01 K/uL (ref 0.00–0.07)
Basophils Absolute: 0 K/uL (ref 0.0–0.1)
Basophils Relative: 1 %
Eosinophils Absolute: 0.2 K/uL (ref 0.0–0.5)
Eosinophils Relative: 6 %
HCT: 37.1 % — ABNORMAL LOW (ref 39.0–52.0)
Hemoglobin: 12.8 g/dL — ABNORMAL LOW (ref 13.0–17.0)
Immature Granulocytes: 0 %
Lymphocytes Relative: 26 %
Lymphs Abs: 1 K/uL (ref 0.7–4.0)
MCH: 31.9 pg (ref 26.0–34.0)
MCHC: 34.5 g/dL (ref 30.0–36.0)
MCV: 92.5 fL (ref 80.0–100.0)
Monocytes Absolute: 0.4 K/uL (ref 0.1–1.0)
Monocytes Relative: 11 %
Neutro Abs: 2.1 K/uL (ref 1.7–7.7)
Neutrophils Relative %: 56 %
Platelets: 160 K/uL (ref 150–400)
RBC: 4.01 MIL/uL — ABNORMAL LOW (ref 4.22–5.81)
RDW: 12.2 % (ref 11.5–15.5)
WBC: 3.7 K/uL — ABNORMAL LOW (ref 4.0–10.5)
nRBC: 0 % (ref 0.0–0.2)

## 2024-01-10 LAB — MAGNESIUM: Magnesium: 2.4 mg/dL (ref 1.7–2.4)

## 2024-01-10 MED ORDER — LEUPROLIDE ACETATE (6 MONTH) 45 MG ~~LOC~~ KIT
45.0000 mg | PACK | Freq: Once | SUBCUTANEOUS | Status: AC
  Administered 2024-01-10: 45 mg via SUBCUTANEOUS
  Filled 2024-01-10: qty 45

## 2024-01-10 NOTE — Progress Notes (Unsigned)
 Patient is taking Zytiga as prescribed. He has not missed any doses and reports no side effects at this time.

## 2024-01-10 NOTE — Patient Instructions (Signed)
 [facility] [place] - A DEPT OF [place]  Discharge Instructions: Thank you for choosing [facility] to provide your oncology and hematology care.  If you have a lab appointment with the Cancer Center - please note that after April 8th, 2024, all labs will be drawn in the cancer center.  You do not have to check in or register with the main entrance as you have in the past but will complete your check-in in the cancer center.  Wear comfortable clothing and clothing appropriate for easy access to any Portacath or PICC line.   We strive to give you quality time with your provider. You may need to reschedule your appointment if you arrive late (15 or more minutes).  Arriving late affects you and other patients whose appointments are after yours.  Also, if you miss three or more appointments without notifying the office, you may be dismissed from the clinic at the provider's discretion.      For prescription refill requests, have your pharmacy contact our office and allow 72 hours for refills to be completed.    Today you received the following Eligard , return as scheduled.   To help prevent nausea and vomiting after your treatment, we encourage you to take your nausea medication as directed.  BELOW ARE SYMPTOMS THAT SHOULD BE REPORTED IMMEDIATELY: *FEVER GREATER THAN 100.4 F (38 C) OR HIGHER *CHILLS OR SWEATING *NAUSEA AND VOMITING THAT IS NOT CONTROLLED WITH YOUR NAUSEA MEDICATION *UNUSUAL SHORTNESS OF BREATH *UNUSUAL BRUISING OR BLEEDING *URINARY PROBLEMS (pain or burning when urinating, or frequent urination) *BOWEL PROBLEMS (unusual diarrhea, constipation, pain near the anus) TENDERNESS IN MOUTH AND THROAT WITH OR WITHOUT PRESENCE OF ULCERS (sore throat, sores in mouth, or a toothache) UNUSUAL RASH, SWELLING OR PAIN  UNUSUAL VAGINAL DISCHARGE OR ITCHING   Items with * indicate a potential emergency and should be followed up as soon as possible or  go to the Emergency Department if any problems should occur.  Please show the CHEMOTHERAPY ALERT CARD or IMMUNOTHERAPY ALERT CARD at check-in to the Emergency Department and triage nurse.  Should you have questions after your visit or need to cancel or reschedule your appointment, please contact [facility] [place] - A DEPT OF [name] [phone]  and follow the prompts.  Office hours are 8:00 a.m. to 4:30 p.m. Monday - Friday. Please note that voicemails left after 4:00 p.m. may not be returned until the following business day.  We are closed weekends and major holidays. You have access to a nurse at all times for urgent questions. Please call the main number to the clinic [phone] and follow the prompts.  For any non-urgent questions, you may also contact your provider using MyChart. We now offer e-Visits for anyone 48 and older to request care online for non-urgent symptoms. For details visit [email or web address].   Also download the MyChart app! Go to the app store, search MyChart, open the app, select [place], and log in with your MyChart username and password.

## 2024-01-10 NOTE — Progress Notes (Signed)
 Patient tolerated injection with no complaints voiced. Site clean and dry with no bruising or swelling noted at site. See MAR for details. Band aid applied.  Patient stable during and after injection. VSS with discharge and left in satisfactory condition with no s/s of distress noted.

## 2024-01-10 NOTE — Patient Instructions (Signed)
 [facility] at [facility] Discharge Instructions   You were seen and examined today by Dr. Davonna.  She reviewed the results of your lab work which are normal/stable.   We will proceed with your injection today.   Return as scheduled.    Thank you for choosing [facility] to provide your oncology and hematology care.  To afford each patient quality time with our provider, please arrive at least 15 minutes before your scheduled appointment time.   If you have a lab appointment with the Cancer Center please come in thru the Main Entrance and check in at the main information desk.  You need to re-schedule your appointment should you arrive 10 or more minutes late.  We strive to give you quality time with our providers, and arriving late affects you and other patients whose appointments are after yours.  Also, if you no show three or more times for appointments you may be dismissed from the clinic at the providers discretion.     Again, thank you for choosing [facility].  Our hope is that these requests will decrease the amount of time that you wait before being seen by our physicians.       _____________________________________________________________  Should you have questions after your visit to [facility], please contact our office at [phone] and follow the prompts.  Our office hours are 8:00 a.m. and 4:30 p.m. Monday - Friday.  Please note that voicemails left after 4:00 p.m. may not be returned until the following business day.  We are closed weekends and major holidays.  You do have access to a nurse 24-7, just call the main number to the clinic [phone] and do not press any options, hold on the line and a nurse will answer the phone.    For prescription refill requests, have your pharmacy contact our office and allow 72 hours.    Due to Covid, you will need to wear a mask upon entering the  hospital. If you do not have a mask, a mask will be given to you at the Main Entrance upon arrival. For doctor visits, patients may have 1 support person age 33 or older with them. For treatment visits, patients can not have anyone with them due to social distancing guidelines and our immunocompromised population.

## 2024-01-10 NOTE — Progress Notes (Incomplete)
 Patient Care Team: Graydon Mt, MD as PCP - General (Internal Medicine) Brendan Buddle, RN as Registered Nurse (Medical Oncology) Brendan Joesph SQUIBB, RN as Oncology Nurse Navigator (Medical Oncology)  Clinic Day:  01/10/2024  Referring physician: Graydon Mt, MD   CHIEF COMPLAINT:  CC: ***  Brendan Anderson 68 y.o. male was transferred to my care after his prior physician has left.   ASSESSMENT & PLAN:   Assessment & Plan: Brendan Anderson  is a 68 y.o. male with ***  Assessment & Plan     The patient understands the plans discussed today and is in agreement with them.  He knows to contact our office if he develops concerns prior to his next appointment.  *** minutes of total time was spent for this patient encounter, including preparation,review of records,  face-to-face counseling with the patient and coordination of care, physical exam, and documentation of the encounter.   Brendan Anderson  [facility] [place] - A DEPT OF [name] [address] [place] [address] Dept: [phone] Dept Fax: [phone]   No orders of the defined types were placed in this encounter.    ONCOLOGY HISTORY:   I have reviewed his chart and materials related to his cancer extensively and collaborated history with the patient. Summary of oncologic history is as follows:   Metastatic castrate sensitive prostate cancer with  Current Treatment: Abiraterone  750 mg daily and prednisone  5 mg daily  INTERVAL HISTORY:   Discussed the use of AI scribe software for clinical note transcription with the patient, who gave verbal consent to proceed.  History of Present Illness Brendan Anderson is here today for follow up and to establish care with me for prostate cancer. Patient is accompanied by his wife today.   He experiences shortness of breath primarily during exertion, such as climbing stairs or walking to the mailbox, ongoing for  about a month. He needs to stop and catch his breath after exertion. No shortness of breath while lying down or during sleep, although he does snore. An echocardiogram performed by a cardiologist returned normal results, and he has not yet had a follow-up appointment.  He is currently taking Zytiga , 1000 mg in the morning, and prednisone , 5 mg daily. He feels weak from Zytiga  but does not attribute his shortness of breath to this medication. He also takes Zoloft  and Ritalin  for fatigue. He experiences fatigue and weakness, which he associates with his prostate cancer treatment.  His past medical history includes prostate cancer, which is well-controlled with a PSA level of 0.09. He was evaluated for sleep apnea about ten years ago, with negative results.  He denies smoking currently but has a history of smoking. He generally sleeps well, although his wife notes he often stays up late and has difficulty falling asleep. He maintains some physical activity but notes reduced capacity due to fatigue. No leg swelling, although mild swelling is noted. No significant shortness of breath with minimal exertion, but experiences it with more strenuous activities.   I have reviewed the past medical history, past surgical history, social history and family history with the patient and they are unchanged from previous note.  ALLERGIES:  is allergic to codeine.  MEDICATIONS:  Current Outpatient Medications  Medication Sig Dispense Refill  . abiraterone  acetate (ZYTIGA ) 250 MG tablet Take 4 tablets (1,000 mg total) by mouth daily. Take on an empty stomach 1 hour before or 2 hours after a meal  120 tablet 3  . Al Hyd-Mg Tr-Alg Ac-Sod Bicarb (GAVISCON-2 PO) Take 2 tablets by mouth at bedtime.    SABRA ascorbic acid (VITAMIN C) 250 MG tablet Take 500 mg by mouth daily at 12 noon.    . Calcium Carbonate Antacid 400 MG CHEW Chew 800 mg by mouth 2 (two) times daily.    . celecoxib  (CELEBREX ) 200 MG capsule Take 200 mg by  mouth daily.    . cetirizine (ZYRTEC) 10 MG tablet Take 10 mg by mouth daily as needed for allergies.    . cyclobenzaprine (FLEXERIL) 5 MG tablet Take 5 mg by mouth at bedtime as needed.    SABRA denosumab (PROLIA) 60 MG/ML SOSY injection Inject 60 mg into the skin every 6 (six) months.    . esomeprazole (NEXIUM) 40 MG capsule Take 40 mg by mouth daily.    . meclizine  (ANTIVERT ) 12.5 MG tablet Take 1 tablet (12.5 mg total) by mouth 3 (three) times daily as needed for dizziness. 15 tablet 0  . METAMUCIL FIBER PO Take by mouth.    . methylphenidate  (RITALIN ) 5 MG tablet Take 1 tablet (5 mg total) by mouth 2 (two) times daily. 30 tablet 0  . Multiple Vitamin (MULTIVITAMIN) tablet Take 1 tablet by mouth daily.    . Multiple Vitamins-Minerals (MULTIVITAMIN ADULT, MINERALS,) TABS Take 1 tablet by mouth daily.    . pantoprazole  (PROTONIX ) 40 MG tablet Take 40 mg by mouth daily.    . predniSONE  (DELTASONE ) 5 MG tablet Take 1 tablet by mouth once daily with breakfast 30 tablet 5  . predniSONE  (STERAPRED UNI-PAK 21 TAB) 10 MG (21) TBPK tablet SMARTSIG:- Tablet(s) By Mouth -    . sertraline  (ZOLOFT ) 25 MG tablet Take 1 tablet (25 mg total) by mouth daily. 90 tablet 3  . tizanidine (ZANAFLEX) 2 MG capsule Take 2 mg by mouth daily as needed for muscle spasms.    . traZODone  (DESYREL ) 100 MG tablet Take 100 mg by mouth at bedtime.    . Vitamin D, Ergocalciferol, (DRISDOL) 1.25 MG (50000 UNIT) CAPS capsule Take 50,000 Units by mouth once a week. Wednesday     No current facility-administered medications for this visit.    REVIEW OF SYSTEMS:   Constitutional: Denies fevers, chills or abnormal weight loss Eyes: Denies blurriness of vision Ears, nose, mouth, throat, and face: Denies mucositis or sore throat Respiratory: Denies cough, dyspnea or wheezes Cardiovascular: Denies palpitation, chest discomfort or lower extremity swelling Gastrointestinal:  Denies nausea, heartburn or change in bowel habits Skin:  Denies abnormal skin rashes Lymphatics: Denies new lymphadenopathy or easy bruising Neurological:Denies numbness, tingling or new weaknesses Behavioral/Psych: Mood is stable, no new changes  All other systems were reviewed with the patient and are negative.   VITALS:  There were no vitals taken for this visit.  Wt Readings from Last 3 Encounters:  10/03/23 185 lb 3 oz (84 kg)  07/11/23 183 lb 13.8 oz (83.4 kg)  04/25/23 181 lb 3.5 oz (82.2 kg)    There is no height or weight on file to calculate BMI.  Performance status (ECOG): 2 - Symptomatic, <50% confined to bed  PHYSICAL EXAM:   GENERAL:alert, no distress and comfortable SKIN: skin color, texture, turgor are normal, no rashes or significant lesions LYMPH:  no palpable lymphadenopathy in the cervical, axillary or inguinal LUNGS: clear to auscultation and percussion with normal breathing effort HEART: regular rate & rhythm and no murmurs and no lower extremity edema ABDOMEN:abdomen soft, non-tender and normal  bowel sounds Musculoskeletal:no cyanosis of digits and no clubbing  NEURO: alert & oriented x 3 with fluent speech  LABORATORY DATA:  I have reviewed the data as listed   Lab Results  Component Value Date   WBC 3.7 (L) 01/10/2024   NEUTROABS 2.1 01/10/2024   HGB 12.8 (L) 01/10/2024   HCT 37.1 (L) 01/10/2024   MCV 92.5 01/10/2024   PLT 160 01/10/2024      Chemistry      Component Value Date/Time   NA 141 01/10/2024 1105   K 3.8 01/10/2024 1105   CL 105 01/10/2024 1105   CO2 27 01/10/2024 1105   BUN 18 01/10/2024 1105   CREATININE 1.13 01/10/2024 1105      Component Value Date/Time   CALCIUM 9.2 01/10/2024 1105   ALKPHOS 50 01/10/2024 1105   AST 17 01/10/2024 1105   ALT 11 01/10/2024 1105   BILITOT 0.4 01/10/2024 1105       RADIOGRAPHIC STUDIES: I have personally reviewed the radiological images as listed and agreed with the findings in the report.  MR BRAIN WO CONTRAST CLINICAL DATA:  Sudden  onset headache and transient alteration awareness, unsteadiness on his feet, persistent headache  EXAM: MRI HEAD WITHOUT CONTRAST  TECHNIQUE: Multiplanar, multiecho pulse sequences of the brain and surrounding structures were obtained without intravenous contrast.  COMPARISON:  No prior MRI available, correlation is made with 01/20/2023 CT head  FINDINGS: Brain: No restricted diffusion to suggest acute or subacute infarct. No acute hemorrhage, mass, mass effect, or midline shift. No hydrocephalus or extra-axial collection. Pituitary and craniocervical junction within normal limits.  No hemosiderin deposition to suggest remote hemorrhage. 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 Scattered and confluent T2 hyperintense signal in the periventricular white matter, likely the sequela of mild-to-moderate chronic small vessel ischemic disease.  Vascular: Normal arterial flow voids.  Skull and upper cervical spine: Normal marrow signal.  Sinuses/Orbits: Mucosal thickening in inferior right frontal sinus. Small mucous retention cyst in the right nasal cavity. No acute finding in the orbits. Status post bilateral lens replacements.  Other: The mastoid air cells are well aerated.  IMPRESSION: No acute intracranial process. No evidence of acute or subacute infarct.  Electronically Signed   By: Donald Campion M.D.   On: 01/20/2023 19:02 CT Head Wo Contrast CLINICAL DATA:  Headache, increasing frequency or severity  EXAM: CT HEAD WITHOUT CONTRAST  TECHNIQUE: Contiguous axial images were obtained from the base of the skull through the vertex without intravenous contrast.  RADIATION DOSE REDUCTION: This exam was performed according to the departmental dose-optimization program which includes automated exposure control, adjustment of the mA and/or kV according to patient size and/or use of iterative reconstruction technique.  COMPARISON:  None  Available.  FINDINGS: Brain: No evidence of acute infarction, hemorrhage, hydrocephalus, extra-axial collection or mass lesion/mass effect.  Vascular: No hyperdense vessel or unexpected calcification.  Skull: Normal. Negative for fracture or focal lesion.  Sinuses/Orbits: No middle ear or mastoid effusion. Paranasal sinuses are notable for postsurgical changes from prior FESS. Bilateral lens replacement. Orbits are otherwise unremarkable.  Other: None.  IMPRESSION: No CT etiology for headaches identified  Electronically Signed   By: Lyndall Gore M.D.   On: 01/20/2023 15:08

## 2024-01-10 NOTE — Progress Notes (Signed)
 " Patient Care Team: Brendan Mt, MD as PCP - General (Internal Medicine) Grayce Buddle, RN as Registered Nurse (Medical Oncology) Celestia Joesph SQUIBB, RN as Oncology Nurse Navigator (Medical Oncology)  Clinic Day:  01/11/2024  Referring physician: Graydon Mt, MD   CHIEF COMPLAINT:  CC: Metastatic castrate sensitive prostate cancer   Brendan Anderson 68 y.o. male was transferred to my care after his prior physician has left.   ASSESSMENT & PLAN:   Assessment & Plan: Brendan Anderson  is a 68 y.o. male with metastatic castrate sensitive prostate cancer   Metastatic castrate sensitive prostate cancer Oncology history below  - Patient is currently on abiraterone  1000 mg and prednisone  5 mg daily.  He reports significant fatigue and is likely related to abiraterone .  Will dose reduce abiraterone  to 750 mg daily and continue prednisone  5 mg. -Labs reviewed today:CMP: WNL, normal LFTs.  CBC: WBC: 3.7, hemoglobin: 12.8, hematocrit: 37.1, platelets: 160, PSA: 0.09. - Continue Eligard  every 6 months.  Due today.  Return to clinic in 3 months with labs.  Osteoporosis - Continue Prolia injections every 6 months with Dr. Elaine. - Continue calcium and vitamin D supplementation  Mood disorder Fatigue Patient was previously started on Zoloft  25 mg daily and Ritalin  5 mg daily and has seen some benefit.  - Continue Zoloft  and Ritalin   The patient understands the plans discussed today and is in agreement with them.  He knows to contact our office if he develops concerns prior to his next appointment.  40 minutes of total time was spent for this patient encounter, including preparation,review of records,  face-to-face counseling with the patient and coordination of care, physical exam, and documentation of the encounter.   Brendan Dry, MD  [facility] [place] - A DEPT OF [name] [address] [place] [address] Dept:  [phone] Dept Fax: [phone]   No orders of the defined types were placed in this encounter.    ONCOLOGY HISTORY:   I have reviewed his chart and materials related to his cancer extensively and collaborated history with the patient. Summary of oncologic history is as follows:   Diagnosis: Metastatic castrate sensitive prostate cancer   -04/24/2019: PSA 6.3 -04/24/2019: Prostate biopsy.   Pathology: Prostatic adenocarcinoma in 3/6 cores, Gleason score 4+5=9 06/03/2019: NM Body scan: Mild degenerative changes within the bilateral knees, without evidence to suggest the presence of osseous metastasis. -06/27/2019: Radical prostatectomy and lymph node resection.  Pathology: Prostatic adenocarcinoma, Gleason score 4+5=9. Extraprostatic extension present at right posterior mid, bladder neck, and bilateral posterior base. Bilateral seminal vesicle invasion identified. Lymphovascular invasion identified. Negative margins. Metastatic adenocarcinoma in one of nine bilateral pelvic lymph nodes (1/9). Staging: pT3b, pN1   -10/17/2019: PSA 0.25 -12/02/2019-01/23/2020: Radiation therapy to prostate fossa and pelvic lymph nodes with short-term ADT -12/11/2019: Germline mutation testing ([facility]) negative  -01/2020-12/2020: PSA remained in normal range after radiation -11/04/2020: Total testosterone : 427.1 -02/09/2021: PSA 0.46 -06/08/2021: PSA 2.04 -07/05/2021: PSMA PET:  No evidence local prostate cancer recurrence in the prostatectomy bed. Two very small presumed foci of nodal tissue in the LEFT middle mediastinum along the descending thoracic aorta have radiotracer activity. Findings concerning for nodal prostate cancer metastasis. No evidence of local nodal metastasis in the pelvis. No evidence of visceral metastasis or skeletal metastasis. -08/16/2021-08/27/2021: SBRT to PET positive intrathoracic lymph nodes -10/22/2021: PSA 2.26 -10/17/2022: PSMA PET: Two new small skeletal metastasis with  intense radiotracer activity: L1 vertebral body  and LEFT transverse process of T11. No evidence of local prostate carcinoma recurrence in the prostate bed. No evidence of metastatic adenopathy in the pelvis or periaortic retroperitoneum. Interval resolution of radiotracer activity small mediastinal lymph nodes. -11/22/2022: Radiation therapy completed -12/15/2022: PSA 12.87 -12/15/2022-current: ADT with abiraterone  1000 mg daily -01/10/2024: Abiraterone  dose decreased to 750 mg daily  -01/10/2023-current: Eligard  45 mg injections every 5 months -02/16/2023-current: PSA within normal range   Current Treatment: Abiraterone  750 mg daily and prednisone  5 mg daily  INTERVAL HISTORY:   Discussed the use of AI scribe software for clinical note transcription with the patient, who gave verbal consent to proceed.  History of Present Illness Brendan Anderson is here today for follow up and to establish care with me for prostate cancer. Patient is accompanied by his wife today.   He experiences shortness of breath primarily during exertion, such as climbing stairs or walking to the mailbox, ongoing for about a month. He needs to stop and catch his breath after exertion. No shortness of breath while lying down or during sleep, although he does snore. An echocardiogram performed by a cardiologist returned normal results, and he has not yet had a follow-up appointment.  He is currently taking Zytiga , 1000 mg in the morning, and prednisone , 5 mg daily. He feels weak from Zytiga  but does not attribute his shortness of breath to this medication. He also takes Zoloft  and Ritalin  for fatigue. He experiences fatigue and weakness, which he associates with his prostate cancer treatment.  His past medical history includes prostate cancer, which is well-controlled with a PSA level of 0.09. He was evaluated for sleep apnea about ten years ago, with negative results.  He denies smoking currently but has a history of  smoking. He generally sleeps well, although his wife notes he often stays up late and has difficulty falling asleep. He maintains some physical activity but notes reduced capacity due to fatigue. No leg swelling, although mild swelling is noted. No significant shortness of breath with minimal exertion, but experiences it with more strenuous activities.   I have reviewed the past medical history, past surgical history, social history and family history with the patient and they are unchanged from previous note.  ALLERGIES:  is allergic to codeine.  MEDICATIONS:  Current Outpatient Medications  Medication Sig Dispense Refill   abiraterone  acetate (ZYTIGA ) 250 MG tablet Take 4 tablets (1,000 mg total) by mouth daily. Take on an empty stomach 1 hour before or 2 hours after a meal 120 tablet 3   Al Hyd-Mg Tr-Alg Ac-Sod Bicarb (GAVISCON-2 PO) Take 2 tablets by mouth at bedtime.     ascorbic acid (VITAMIN C) 250 MG tablet Take 500 mg by mouth daily at 12 noon.     Calcium Carbonate Antacid 400 MG CHEW Chew 800 mg by mouth 2 (two) times daily.     celecoxib  (CELEBREX ) 200 MG capsule Take 200 mg by mouth daily.     cetirizine (ZYRTEC) 10 MG tablet Take 10 mg by mouth daily as needed for allergies.     cyclobenzaprine (FLEXERIL) 5 MG tablet Take 5 mg by mouth at bedtime as needed.     denosumab (PROLIA) 60 MG/ML SOSY injection Inject 60 mg into the skin every 6 (six) months.     esomeprazole (NEXIUM) 40 MG capsule Take 40 mg by mouth daily.     meclizine  (ANTIVERT ) 12.5 MG tablet Take 1 tablet (12.5 mg total) by mouth 3 (three) times daily as needed for dizziness.  15 tablet 0   METAMUCIL FIBER PO Take by mouth.     methylphenidate  (RITALIN ) 5 MG tablet Take 1 tablet (5 mg total) by mouth 2 (two) times daily. 30 tablet 0   Multiple Vitamin (MULTIVITAMIN) tablet Take 1 tablet by mouth daily.     Multiple Vitamins-Minerals (MULTIVITAMIN ADULT, MINERALS,) TABS Take 1 tablet by mouth daily.     pantoprazole   (PROTONIX ) 40 MG tablet Take 40 mg by mouth daily.     predniSONE  (DELTASONE ) 5 MG tablet Take 1 tablet by mouth once daily with breakfast 30 tablet 5   predniSONE  (STERAPRED UNI-PAK 21 TAB) 10 MG (21) TBPK tablet SMARTSIG:- Tablet(s) By Mouth -     sertraline  (ZOLOFT ) 25 MG tablet Take 1 tablet (25 mg total) by mouth daily. 90 tablet 3   tizanidine (ZANAFLEX) 2 MG capsule Take 2 mg by mouth daily as needed for muscle spasms.     traZODone  (DESYREL ) 100 MG tablet Take 100 mg by mouth at bedtime.     Vitamin D, Ergocalciferol, (DRISDOL) 1.25 MG (50000 UNIT) CAPS capsule Take 50,000 Units by mouth once a week. Wednesday     No current facility-administered medications for this visit.    REVIEW OF SYSTEMS:   Constitutional: Denies fevers, chills or abnormal weight loss Eyes: Denies blurriness of vision Ears, nose, mouth, throat, and face: Denies mucositis or sore throat Respiratory: Denies cough, dyspnea or wheezes Cardiovascular: Denies palpitation, chest discomfort or lower extremity swelling Gastrointestinal:  Denies nausea, heartburn or change in bowel habits Skin: Denies abnormal skin rashes Lymphatics: Denies new lymphadenopathy or easy bruising Neurological:Denies numbness, tingling or new weaknesses Behavioral/Psych: Mood is stable, no new changes  All other systems were reviewed with the patient and are negative.   VITALS:  Blood pressure 113/73, pulse 85, temperature 98.2 F (36.8 C), temperature source Tympanic, resp. rate 18, height 5' 8 (1.727 m), weight 189 lb (85.7 kg), SpO2 99%.  Wt Readings from Last 3 Encounters:  01/10/24 189 lb (85.7 kg)  10/03/23 185 lb 3 oz (84 kg)  07/11/23 183 lb 13.8 oz (83.4 kg)    Body mass index is 28.74 kg/m.  Performance status (ECOG): 2 - Symptomatic, <50% confined to bed  PHYSICAL EXAM:   GENERAL:alert, no distress and comfortable SKIN: skin color, texture, turgor are normal, no rashes or significant lesions LYMPH:  no palpable  lymphadenopathy in the cervical, axillary or inguinal LUNGS: clear to auscultation and percussion with normal breathing effort HEART: regular rate & rhythm and no murmurs and no lower extremity edema ABDOMEN:abdomen soft, non-tender and normal bowel sounds Musculoskeletal:no cyanosis of digits and no clubbing  NEURO: alert & oriented x 3 with fluent speech  LABORATORY DATA:  I have reviewed the data as listed   Lab Results  Component Value Date   WBC 3.7 (L) 01/10/2024   NEUTROABS 2.1 01/10/2024   HGB 12.8 (L) 01/10/2024   HCT 37.1 (L) 01/10/2024   MCV 92.5 01/10/2024   PLT 160 01/10/2024      Chemistry      Component Value Date/Time   NA 141 01/10/2024 1105   K 3.8 01/10/2024 1105   CL 105 01/10/2024 1105   CO2 27 01/10/2024 1105   BUN 18 01/10/2024 1105   CREATININE 1.13 01/10/2024 1105      Component Value Date/Time   CALCIUM 9.2 01/10/2024 1105   ALKPHOS 50 01/10/2024 1105   AST 17 01/10/2024 1105   ALT 11 01/10/2024 1105   BILITOT 0.4  01/10/2024 1105      Latest Reference Range & Units 10/03/23 13:38  Prostatic Specific Antigen 0.00 - 4.00 ng/mL 0.09    RADIOGRAPHIC STUDIES: I have personally reviewed the radiological images as listed and agreed with the findings in the report.  "

## 2024-01-11 ENCOUNTER — Encounter: Payer: Self-pay | Admitting: Oncology

## 2024-01-25 ENCOUNTER — Other Ambulatory Visit: Payer: Self-pay

## 2024-01-29 ENCOUNTER — Other Ambulatory Visit: Payer: Self-pay

## 2024-01-29 ENCOUNTER — Other Ambulatory Visit: Payer: Self-pay | Admitting: Pharmacy Technician

## 2024-01-29 NOTE — Progress Notes (Signed)
 Clinical Intervention Note  Clinical Intervention Notes: Patient reported decreasing Zytiga  to 3 tablets daily. Although there is no new prescription on file for this dose, it was confirmed from OV on 10/29 that dose was reduced due to significant fatigue.   Clinical Intervention Outcomes: Improved therapy adherence   [facility] Specialty Pharmacist

## 2024-01-29 NOTE — Progress Notes (Signed)
 Specialty Pharmacy Refill Coordination Note  Brendan Anderson is a 68 y.o. male contacted today regarding refills of specialty medication(s) Abiraterone  Acetate (ZYTIGA )   Patient requested Delivery   Delivery date: 02/05/24   Verified address: [address]  [place]   Medication will be filled on: 02/02/24

## 2024-02-01 ENCOUNTER — Other Ambulatory Visit: Payer: Self-pay

## 2024-02-12 ENCOUNTER — Other Ambulatory Visit: Payer: Self-pay | Admitting: *Deleted

## 2024-02-12 MED ORDER — METHYLPHENIDATE HCL 5 MG PO TABS: 5.0000 mg | ORAL_TABLET | Freq: Two times a day (BID) | ORAL | 0 refills | Status: DC

## 2024-02-23 ENCOUNTER — Other Ambulatory Visit: Payer: Self-pay

## 2024-02-23 ENCOUNTER — Other Ambulatory Visit (HOSPITAL_COMMUNITY): Payer: Self-pay

## 2024-02-23 NOTE — Progress Notes (Signed)
 Specialty Pharmacy Refill Coordination Note  Brendan Anderson is a 68 y.o. male contacted today regarding refills of specialty medication(s) Abiraterone  Acetate (ZYTIGA )   Patient requested Delivery   Delivery date: 02/26/24   Verified address: [address]  [place]   Medication will be filled on: 02/23/24  Patient is aware of the $59.06 Copay for this fill.

## 2024-03-11 ENCOUNTER — Encounter: Payer: Self-pay | Admitting: *Deleted

## 2024-03-13 ENCOUNTER — Other Ambulatory Visit: Payer: Self-pay | Admitting: Oncology

## 2024-03-13 ENCOUNTER — Other Ambulatory Visit: Payer: Self-pay

## 2024-03-13 DIAGNOSIS — C61 Malignant neoplasm of prostate: Secondary | ICD-10-CM

## 2024-03-15 ENCOUNTER — Encounter: Payer: Self-pay | Admitting: Oncology

## 2024-03-15 ENCOUNTER — Other Ambulatory Visit: Payer: Self-pay

## 2024-03-15 MED ORDER — ABIRATERONE ACETATE 250 MG PO TABS
1000.0000 mg | ORAL_TABLET | Freq: Every day | ORAL | 3 refills | Status: DC
  Filled 2024-03-15 – 2024-03-19 (×2): qty 120, 30d supply, fill #0

## 2024-03-15 NOTE — Telephone Encounter (Signed)
 Chart reviewed. Zytiga  refilled per last office note with Dr. Davonna.

## 2024-03-19 ENCOUNTER — Other Ambulatory Visit: Payer: Self-pay

## 2024-03-19 ENCOUNTER — Other Ambulatory Visit (HOSPITAL_COMMUNITY): Payer: Self-pay

## 2024-03-25 ENCOUNTER — Other Ambulatory Visit: Payer: Self-pay | Admitting: *Deleted

## 2024-03-25 MED ORDER — METHYLPHENIDATE HCL 5 MG PO TABS: 5.0000 mg | ORAL_TABLET | Freq: Two times a day (BID) | ORAL | 0 refills | Status: AC

## 2024-03-25 MED ORDER — PREDNISONE 5 MG PO TABS: 5.0000 mg | ORAL_TABLET | Freq: Every day | ORAL | 5 refills | Status: AC

## 2024-03-27 ENCOUNTER — Other Ambulatory Visit (HOSPITAL_COMMUNITY): Payer: Self-pay

## 2024-03-27 ENCOUNTER — Telehealth: Payer: Self-pay | Admitting: Pharmacy Technician

## 2024-03-27 ENCOUNTER — Encounter: Payer: Self-pay | Admitting: Oncology

## 2024-03-27 NOTE — Telephone Encounter (Signed)
 Oral Oncology Patient Advocate Encounter  Was successful in securing patient a $6000 grant from [facility] to provide copayment coverage for abiraterone .  This will keep the out of pocket expense at $0.     [employer] ID: [id number]   The billing information is as follows and has been shared with [facility].    RxBin: [id number] PCN: [id number] Member ID: [id number] Group ID: [id number] Dates of Eligibility: 02/18/2024 through 02/16/2025  Fund:  Prostate Cancer - Medicare Access  [place] (Patty) Chet Burnet, CPhT  [facility], Zelda Salmon, [facility] - Oral Chemotherapy Patient Advocate Specialist III Phone: [phone]  Fax: [phone]

## 2024-04-08 ENCOUNTER — Other Ambulatory Visit: Payer: Self-pay | Admitting: Pharmacy Technician

## 2024-04-08 NOTE — Progress Notes (Signed)
 Oral Oncology Patient Advocate Encounter  Lorrene obtained and added into [place].  [place] (Patty) Chet Burnet, CPhT  [facility], Zelda Salmon, [facility] - Oral Chemotherapy Patient Advocate Specialist III Phone: [phone]  Fax: [phone]

## 2024-04-10 ENCOUNTER — Other Ambulatory Visit: Payer: Self-pay

## 2024-04-10 DIAGNOSIS — C61 Malignant neoplasm of prostate: Secondary | ICD-10-CM

## 2024-04-11 ENCOUNTER — Inpatient Hospital Stay: Admitting: Oncology

## 2024-04-11 ENCOUNTER — Inpatient Hospital Stay: Attending: Hematology

## 2024-04-11 VITALS — BP 123/72 | HR 81 | Temp 98.2°F | Resp 18 | Wt 181.0 lb

## 2024-04-11 DIAGNOSIS — M810 Age-related osteoporosis without current pathological fracture: Secondary | ICD-10-CM | POA: Insufficient documentation

## 2024-04-11 DIAGNOSIS — F39 Unspecified mood [affective] disorder: Secondary | ICD-10-CM | POA: Insufficient documentation

## 2024-04-11 DIAGNOSIS — C61 Malignant neoplasm of prostate: Secondary | ICD-10-CM | POA: Diagnosis present

## 2024-04-11 DIAGNOSIS — Z9079 Acquired absence of other genital organ(s): Secondary | ICD-10-CM | POA: Insufficient documentation

## 2024-04-11 DIAGNOSIS — Z806 Family history of leukemia: Secondary | ICD-10-CM | POA: Insufficient documentation

## 2024-04-11 DIAGNOSIS — Z8042 Family history of malignant neoplasm of prostate: Secondary | ICD-10-CM | POA: Diagnosis not present

## 2024-04-11 DIAGNOSIS — Z803 Family history of malignant neoplasm of breast: Secondary | ICD-10-CM | POA: Diagnosis not present

## 2024-04-11 DIAGNOSIS — Z87891 Personal history of nicotine dependence: Secondary | ICD-10-CM | POA: Diagnosis not present

## 2024-04-11 DIAGNOSIS — C7951 Secondary malignant neoplasm of bone: Secondary | ICD-10-CM | POA: Diagnosis present

## 2024-04-11 DIAGNOSIS — Z808 Family history of malignant neoplasm of other organs or systems: Secondary | ICD-10-CM | POA: Insufficient documentation

## 2024-04-11 DIAGNOSIS — Z7952 Long term (current) use of systemic steroids: Secondary | ICD-10-CM | POA: Insufficient documentation

## 2024-04-11 DIAGNOSIS — R5383 Other fatigue: Secondary | ICD-10-CM | POA: Insufficient documentation

## 2024-04-11 DIAGNOSIS — Z923 Personal history of irradiation: Secondary | ICD-10-CM | POA: Diagnosis not present

## 2024-04-11 DIAGNOSIS — Z79899 Other long term (current) drug therapy: Secondary | ICD-10-CM | POA: Insufficient documentation

## 2024-04-11 DIAGNOSIS — M545 Low back pain, unspecified: Secondary | ICD-10-CM | POA: Insufficient documentation

## 2024-04-11 LAB — COMPREHENSIVE METABOLIC PANEL WITH GFR
ALT: 12 U/L (ref 0–44)
AST: 16 U/L (ref 15–41)
Albumin: 4.5 g/dL (ref 3.5–5.0)
Alkaline Phosphatase: 47 U/L (ref 38–126)
Anion gap: 11 (ref 5–15)
BUN: 20 mg/dL (ref 8–23)
CO2: 27 mmol/L (ref 22–32)
Calcium: 8.9 mg/dL (ref 8.9–10.3)
Chloride: 106 mmol/L (ref 98–111)
Creatinine, Ser: 1.17 mg/dL (ref 0.61–1.24)
GFR, Estimated: >60 mL/min
Glucose, Bld: 123 mg/dL — ABNORMAL HIGH (ref 70–99)
Potassium: 4.4 mmol/L (ref 3.5–5.1)
Sodium: 144 mmol/L (ref 135–145)
Total Bilirubin: 0.4 mg/dL (ref 0.0–1.2)
Total Protein: 6.6 g/dL (ref 6.5–8.1)

## 2024-04-11 LAB — CBC WITH DIFFERENTIAL/PLATELET
Abs Immature Granulocytes: 0.01 10*3/uL (ref 0.00–0.07)
Basophils Absolute: 0 10*3/uL (ref 0.0–0.1)
Basophils Relative: 1 %
Eosinophils Absolute: 0.2 10*3/uL (ref 0.0–0.5)
Eosinophils Relative: 6 %
HCT: 37.7 % — ABNORMAL LOW (ref 39.0–52.0)
Hemoglobin: 12.5 g/dL — ABNORMAL LOW (ref 13.0–17.0)
Immature Granulocytes: 0 %
Lymphocytes Relative: 26 %
Lymphs Abs: 1 10*3/uL (ref 0.7–4.0)
MCH: 30.9 pg (ref 26.0–34.0)
MCHC: 33.2 g/dL (ref 30.0–36.0)
MCV: 93.1 fL (ref 80.0–100.0)
Monocytes Absolute: 0.3 10*3/uL (ref 0.1–1.0)
Monocytes Relative: 8 %
Neutro Abs: 2.3 10*3/uL (ref 1.7–7.7)
Neutrophils Relative %: 59 %
Platelets: 152 10*3/uL (ref 150–400)
RBC: 4.05 MIL/uL — ABNORMAL LOW (ref 4.22–5.81)
RDW: 12.8 % (ref 11.5–15.5)
WBC: 3.8 10*3/uL — ABNORMAL LOW (ref 4.0–10.5)
nRBC: 0 % (ref 0.0–0.2)

## 2024-04-11 LAB — PSA: Prostatic Specific Antigen: 0.1 ng/mL (ref 0.00–4.00)

## 2024-04-11 NOTE — Patient Instructions (Addendum)
 [facility] at [facility] Discharge Instructions   You were seen and examined today by Dr. Davonna.  She reviewed the results of your lab work which are normal/stable.   Continue Zytiga  3 pills daily along with prednisone .   We will see you back in 3 months. We will repeat lab work at that time. You will also be due for your Eligard  injection at this visit.   Return as scheduled.    Thank you for choosing [facility] at [facility] to provide your oncology and hematology care.  To afford each patient quality time with our provider, please arrive at least 15 minutes before your scheduled appointment time.   If you have a lab appointment with the Cancer Center please come in thru the Main Entrance and check in at the main information desk.  You need to re-schedule your appointment should you arrive 10 or more minutes late.  We strive to give you quality time with our providers, and arriving late affects you and other patients whose appointments are after yours.  Also, if you no show three or more times for appointments you may be dismissed from the clinic at the providers discretion.     Again, thank you for choosing [facility].  Our hope is that these requests will decrease the amount of time that you wait before being seen by our physicians.       _____________________________________________________________  Should you have questions after your visit to [facility], please contact our office at [phone] and follow the prompts.  Our office hours are 8:00 a.m. and 4:30 p.m. Monday - Friday.  Please note that voicemails left after 4:00 p.m. may not be returned until the following business day.  We are closed weekends and major holidays.  You do have access to a nurse 24-7, just call the main number to the clinic [phone] and do not press any options, hold on the line and a nurse will answer the phone.    For  prescription refill requests, have your pharmacy contact our office and allow 72 hours.    Due to Covid, you will need to wear a mask upon entering the hospital. If you do not have a mask, a mask will be given to you at the Main Entrance upon arrival. For doctor visits, patients may have 1 support person age 65 or older with them. For treatment visits, patients can not have anyone with them due to social distancing guidelines and our immunocompromised population.

## 2024-04-11 NOTE — Progress Notes (Signed)
 " Patient Care Team: Graydon Mt, MD as PCP - General (Internal Medicine) Brendan Buddle, RN as Registered Nurse (Medical Oncology) Celestia Joesph SQUIBB, RN as Oncology Nurse Navigator (Medical Oncology)  Clinic Day:  04/11/2024  Referring physician: Graydon Mt, MD   CHIEF COMPLAINT:  CC: Metastatic castrate sensitive prostate cancer    ASSESSMENT & PLAN:   Assessment & Plan: Brendan Anderson  is a 69 y.o. male with metastatic castrate sensitive prostate cancer   Metastatic castrate sensitive prostate cancer Oncology history below Some improvement in fatigue with Abiraterone  750mg  daily  - Patient is currently on abiraterone  750 mg and prednisone  5 mg daily.   -Labs reviewed today:CMP: WNL, normal LFTs.  CBC: WBC: 3.8, hemoglobin: 12.5, hematocrit: 37.7, platelets: 152, PSA: Pending at this time - Continue Eligard  every 6 months.  Due today.  Return to clinic in 3 months with labs.  Osteoporosis - Continue Prolia injections every 6 months with Dr. Elaine. - Continue calcium and vitamin D supplementation  Mood disorder Fatigue Patient was previously started on Zoloft  25 mg daily and Ritalin  5 mg daily and has seen some benefit.  - Continue Zoloft  and Ritalin   The patient understands the plans discussed today and is in agreement with them.  He knows to contact our office if he develops concerns prior to his next appointment.  The total time spent in the appointment was 18 minutes for the encounter with patient, including review of chart and various tests results, discussions about plan of care and coordination of care plan   Mickiel Dry, MD  [facility] [place] - A DEPT OF [name] [address] [place] [address] Dept: [phone] Dept Fax: [phone]   No orders of the defined types were placed in this encounter.    ONCOLOGY HISTORY:   I have reviewed his chart and materials related to his cancer  extensively and collaborated history with the patient. Summary of oncologic history is as follows:   Diagnosis: Metastatic castrate sensitive prostate cancer   -04/24/2019: PSA 6.3 -04/24/2019: Prostate biopsy.   Pathology: Prostatic adenocarcinoma in 3/6 cores, Gleason score 4+5=9 06/03/2019: NM Body scan: Mild degenerative changes within the bilateral knees, without evidence to suggest the presence of osseous metastasis. -06/27/2019: Radical prostatectomy and lymph node resection.  Pathology: Prostatic adenocarcinoma, Gleason score 4+5=9. Extraprostatic extension present at right posterior mid, bladder neck, and bilateral posterior base. Bilateral seminal vesicle invasion identified. Lymphovascular invasion identified. Negative margins. Metastatic adenocarcinoma in one of nine bilateral pelvic lymph nodes (1/9). Staging: pT3b, pN1   -10/17/2019: PSA 0.25 -12/02/2019-01/23/2020: Radiation therapy to prostate fossa and pelvic lymph nodes with short-term ADT -12/11/2019: Germline mutation testing ([facility]) negative  -01/2020-12/2020: PSA remained in normal range after radiation -11/04/2020: Total testosterone : 427.1 -02/09/2021: PSA 0.46 -06/08/2021: PSA 2.04 -07/05/2021: PSMA PET:  No evidence local prostate cancer recurrence in the prostatectomy bed. Two very small presumed foci of nodal tissue in the LEFT middle mediastinum along the descending thoracic aorta have radiotracer activity. Findings concerning for nodal prostate cancer metastasis. No evidence of local nodal metastasis in the pelvis. No evidence of visceral metastasis or skeletal metastasis. -08/16/2021-08/27/2021: SBRT to PET positive intrathoracic lymph nodes -10/22/2021: PSA 2.26 -10/17/2022: PSMA PET: Two new small skeletal metastasis with intense radiotracer activity: L1 vertebral body and LEFT transverse process of T11. No evidence of local prostate carcinoma recurrence in the prostate bed. No evidence of metastatic adenopathy  in the pelvis or periaortic retroperitoneum.  Interval resolution of radiotracer activity small mediastinal lymph nodes. -11/22/2022: Radiation therapy completed -12/15/2022: PSA 12.87 -12/15/2022-current: ADT with abiraterone  1000 mg daily -01/10/2024: Abiraterone  dose decreased to 750 mg daily  -01/10/2023-current: Eligard  45 mg injections every 5 months -02/16/2023-current: PSA within normal range   Current Treatment: Abiraterone  750 mg daily and prednisone  5 mg daily  INTERVAL HISTORY:   Discussed the use of AI scribe software for clinical note transcription with the patient, who gave verbal consent to proceed.  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   He is currently receiving androgen deprivation therapy (Eligard , last dose October 2025) and abiraterone , recently reduced from 1000 mg to 750 mg daily due to significant fatigue. He initially noted mild improvement in fatigue with the dose reduction, but this benefit has not persisted. He continues to experience severe fatigue, describing it as a lack of strength and terrible, with intermittent good days but overall substantial impact on daily activities. He attributes his fatigue to low testosterone  from therapy. Hemoglobin has been low for a while, with a recent value of 12.5 g/dL. PSA results from today's blood draw are pending.  He identifies lower back pain as his most significant ongoing problem. No new or progressive symptoms, including neurologic deficits, are reported.  He experiences significant difficulty initiating sleep, often requiring two to three hours to fall asleep despite taking trazodone  100 mg nightly, occasionally increasing the dose to 300 mg. He rarely naps during the day, and difficulty with sleep onset persists regardless of daytime rest. Over  the past two to three days, he has fallen asleep more quickly.   Mood is generally improved on sertraline , with only occasional episodes of depressed mood. He denies persistent or daily depressive symptoms and does not experience morning crying.   I have reviewed the past medical history, past surgical history, social history and family history with the patient and they are unchanged from previous note.  ALLERGIES:  is allergic to codeine.  MEDICATIONS:  Current Outpatient Medications  Medication Sig Dispense Refill   abiraterone  acetate (ZYTIGA ) 250 MG tablet Take 4 tablets (1,000 mg total) by mouth daily. Take on an empty stomach 1 hour before or 2 hours after a meal 120 tablet 3   Al Hyd-Mg Tr-Alg Ac-Sod Bicarb (GAVISCON-2 PO) Take 2 tablets by mouth at bedtime.     ascorbic acid (VITAMIN C) 250 MG tablet Take 500 mg by mouth daily at 12 noon.     Calcium Carbonate Antacid 400 MG CHEW Chew 800 mg by mouth 2 (two) times daily.     celecoxib  (CELEBREX ) 200 MG capsule Take 200 mg by mouth daily.     cetirizine (ZYRTEC) 10 MG tablet Take 10 mg by mouth daily as needed for allergies.     cyclobenzaprine (FLEXERIL) 5 MG tablet Take 5 mg by mouth at bedtime as needed.     denosumab (PROLIA) 60 MG/ML SOSY injection Inject 60 mg into the skin every 6 (six) months.     esomeprazole (NEXIUM) 40 MG capsule Take 40 mg by mouth daily.     meclizine  (ANTIVERT ) 12.5 MG tablet Take 1 tablet (12.5 mg total) by mouth 3 (three) times daily as needed for dizziness. 15 tablet 0   METAMUCIL FIBER PO Take by mouth.     methylphenidate  (RITALIN ) 5 MG tablet Take 1 tablet (5 mg total) by mouth 2 (two)  times daily. 30 tablet 0   Multiple Vitamin (MULTIVITAMIN) tablet Take 1 tablet by mouth daily.     Multiple Vitamins-Minerals (MULTIVITAMIN ADULT, MINERALS,) TABS Take 1 tablet by mouth daily.     pantoprazole  (PROTONIX ) 40 MG tablet Take 40 mg by mouth daily.     predniSONE  (DELTASONE ) 5 MG tablet Take 1 tablet  (5 mg total) by mouth daily with breakfast. 30 tablet 5   sertraline  (ZOLOFT ) 25 MG tablet Take 1 tablet (25 mg total) by mouth daily. 90 tablet 3   tizanidine (ZANAFLEX) 2 MG capsule Take 2 mg by mouth daily as needed for muscle spasms.     traZODone  (DESYREL ) 100 MG tablet Take 100 mg by mouth at bedtime.     Vitamin D, Ergocalciferol, (DRISDOL) 1.25 MG (50000 UNIT) CAPS capsule Take 50,000 Units by mouth once a week. Wednesday     No current facility-administered medications for this visit.   VITALS:  There were no vitals taken for this visit.  Wt Readings from Last 3 Encounters:  01/10/24 189 lb (85.7 kg)  10/03/23 185 lb 3 oz (84 kg)  07/11/23 183 lb 13.8 oz (83.4 kg)    There is no height or weight on file to calculate BMI.  Performance status (ECOG): 2 - Symptomatic, <50% confined to bed  PHYSICAL EXAM:   GENERAL:alert, no distress and comfortable SKIN: skin color, texture, turgor are normal, no rashes or significant lesions LYMPH:  no palpable lymphadenopathy in the cervical, axillary or inguinal LUNGS: clear to auscultation and percussion with normal breathing effort HEART: regular rate & rhythm and no murmurs and no lower extremity edema ABDOMEN:abdomen soft, non-tender and normal bowel sounds Musculoskeletal:no cyanosis of digits and no clubbing  NEURO: alert & oriented x 3 with fluent speech  LABORATORY DATA:  I have reviewed the data as listed   Lab Results  Component Value Date   WBC 3.7 (L) 01/10/2024   NEUTROABS 2.1 01/10/2024   HGB 12.8 (L) 01/10/2024   HCT 37.1 (L) 01/10/2024   MCV 92.5 01/10/2024   PLT 160 01/10/2024      Chemistry      Component Value Date/Time   NA 141 01/10/2024 1105   K 3.8 01/10/2024 1105   CL 105 01/10/2024 1105   CO2 27 01/10/2024 1105   BUN 18 01/10/2024 1105   CREATININE 1.13 01/10/2024 1105      Component Value Date/Time   CALCIUM 9.2 01/10/2024 1105   ALKPHOS 50 01/10/2024 1105   AST 17 01/10/2024 1105   ALT 11  01/10/2024 1105   BILITOT 0.4 01/10/2024 1105      Latest Reference Range & Units 10/03/23 13:38  Prostatic Specific Antigen 0.00 - 4.00 ng/mL 0.09    RADIOGRAPHIC STUDIES: I have personally reviewed the radiological images as listed and agreed with the findings in the report.  "

## 2024-04-16 ENCOUNTER — Other Ambulatory Visit: Payer: Self-pay

## 2024-04-16 ENCOUNTER — Other Ambulatory Visit: Payer: Self-pay | Admitting: Oncology

## 2024-04-16 DIAGNOSIS — C61 Malignant neoplasm of prostate: Secondary | ICD-10-CM

## 2024-04-17 ENCOUNTER — Other Ambulatory Visit: Payer: Self-pay

## 2024-04-17 ENCOUNTER — Other Ambulatory Visit (HOSPITAL_COMMUNITY): Payer: Self-pay

## 2024-04-17 ENCOUNTER — Encounter: Payer: Self-pay | Admitting: Oncology

## 2024-04-17 MED ORDER — ABIRATERONE ACETATE 250 MG PO TABS
750.0000 mg | ORAL_TABLET | Freq: Every day | ORAL | 3 refills | Status: AC
  Filled 2024-04-17 (×2): qty 90, 30d supply, fill #0

## 2024-04-17 NOTE — Telephone Encounter (Signed)
 Chart reviewed. Zytiga  refilled per last office note with Dr. Davonna.

## 2024-04-19 ENCOUNTER — Other Ambulatory Visit: Payer: Self-pay

## 2024-04-19 NOTE — Progress Notes (Signed)
 Specialty Pharmacy Refill Coordination Note  Brendan Anderson is a 69 y.o. male contacted today regarding refills of specialty medication(s) Abiraterone  Acetate (ZYTIGA )   Patient requested Delivery   Delivery date: 04/26/24   Verified address: [address]  [place] [facility]   Medication will be filled on: 04/25/24

## 2024-07-10 ENCOUNTER — Inpatient Hospital Stay: Admitting: Oncology

## 2024-07-10 ENCOUNTER — Inpatient Hospital Stay: Attending: Hematology

## 2024-07-10 ENCOUNTER — Inpatient Hospital Stay
